# Patient Record
Sex: Female | Born: 1980 | Hispanic: No | Marital: Single | Smoking: Light tobacco smoker
Health system: Southern US, Community
[De-identification: ages and names within clinical notes are randomized; demographics above are authoritative.]

## PROBLEM LIST (undated history)

## (undated) DIAGNOSIS — K649 Unspecified hemorrhoids: Secondary | ICD-10-CM

## (undated) DIAGNOSIS — IMO0002 Reserved for concepts with insufficient information to code with codable children: Secondary | ICD-10-CM

## (undated) DIAGNOSIS — F329 Major depressive disorder, single episode, unspecified: Secondary | ICD-10-CM

## (undated) DIAGNOSIS — F32A Depression, unspecified: Secondary | ICD-10-CM

## (undated) DIAGNOSIS — F99 Mental disorder, not otherwise specified: Secondary | ICD-10-CM

## (undated) DIAGNOSIS — F410 Panic disorder [episodic paroxysmal anxiety] without agoraphobia: Secondary | ICD-10-CM

## (undated) DIAGNOSIS — R87619 Unspecified abnormal cytological findings in specimens from cervix uteri: Secondary | ICD-10-CM

## (undated) DIAGNOSIS — K219 Gastro-esophageal reflux disease without esophagitis: Secondary | ICD-10-CM

## (undated) DIAGNOSIS — F419 Anxiety disorder, unspecified: Secondary | ICD-10-CM

## (undated) DIAGNOSIS — D649 Anemia, unspecified: Secondary | ICD-10-CM

## (undated) HISTORY — PX: DILATION AND CURETTAGE OF UTERUS: SHX78

---

## 2010-09-21 LAB — ABO/RH: RH Type: POSITIVE

## 2010-09-21 LAB — RPR: RPR: NONREACTIVE

## 2010-09-21 LAB — ANTIBODY SCREEN: Antibody Screen: NEGATIVE

## 2010-09-21 LAB — HIV ANTIBODY (ROUTINE TESTING W REFLEX): HIV: NONREACTIVE

## 2011-02-12 NOTE — L&D Delivery Note (Signed)
Delivery Note At 1:20 PM a viable female was delivered via Vaginal, Spontaneous Delivery (Presentation: Left Occiput Anterior).  APGAR: 9, 9; weight 6 lb 11.8 oz (3056 g).   Placenta status: Intact, Spontaneous.  Cord: 3 vessels with the following complications: None.  Cord pH: none  Anesthesia: None  Episiotomy: None Lacerations: None Suture Repair: none Est. Blood Loss (mL): 300  Mom to postpartum.  Baby to nursery-stable.  Michelle Navarro A 02/15/2011, 2:00 PM

## 2011-02-13 LAB — STREP B DNA PROBE: GBS: NEGATIVE

## 2011-02-15 ENCOUNTER — Inpatient Hospital Stay (HOSPITAL_COMMUNITY)
Admission: AD | Admit: 2011-02-15 | Discharge: 2011-02-17 | DRG: 775 | Disposition: A | Discharge status: Home / Self Care | Payer: Medicaid Other | Source: Ambulatory Visit | Attending: Obstetrics | Admitting: Obstetrics

## 2011-02-15 ENCOUNTER — Encounter (HOSPITAL_COMMUNITY): Payer: Self-pay

## 2011-02-15 HISTORY — DX: Mental disorder, not otherwise specified: F99

## 2011-02-15 HISTORY — DX: Depression, unspecified: F32.A

## 2011-02-15 HISTORY — DX: Panic disorder (episodic paroxysmal anxiety): F41.0

## 2011-02-15 HISTORY — DX: Major depressive disorder, single episode, unspecified: F32.9

## 2011-02-15 HISTORY — DX: Unspecified abnormal cytological findings in specimens from cervix uteri: R87.619

## 2011-02-15 HISTORY — DX: Reserved for concepts with insufficient information to code with codable children: IMO0002

## 2011-02-15 HISTORY — DX: Anxiety disorder, unspecified: F41.9

## 2011-02-15 LAB — CBC
Hemoglobin: 10.5 g/dL — ABNORMAL LOW (ref 12.0–15.0)
MCH: 30.3 pg (ref 26.0–34.0)
MCHC: 32.5 g/dL (ref 30.0–36.0)
RDW: 13.1 % (ref 11.5–15.5)

## 2011-02-15 LAB — RPR: RPR Ser Ql: NONREACTIVE

## 2011-02-15 MED ORDER — ZOLPIDEM TARTRATE 5 MG PO TABS: 5.0000 mg | ORAL_TABLET | Freq: Every evening | ORAL | Status: DC | PRN

## 2011-02-15 MED ORDER — PENICILLIN G POTASSIUM 5000000 UNITS IJ SOLR
2.5000 10*6.[IU] | INTRAMUSCULAR | Status: DC
  Filled 2011-02-15 (×4): qty 2.5

## 2011-02-15 MED ORDER — MEDROXYPROGESTERONE ACETATE 150 MG/ML IM SUSP: 150.0000 mg | INTRAMUSCULAR | Status: DC | PRN

## 2011-02-15 MED ORDER — ONDANSETRON HCL 4 MG PO TABS: 4.0000 mg | ORAL_TABLET | ORAL | Status: DC | PRN

## 2011-02-15 MED ORDER — LACTATED RINGERS IV SOLN
INTRAVENOUS | Status: DC
  Administered 2011-02-15: 12:00:00 via INTRAVENOUS

## 2011-02-15 MED ORDER — LIDOCAINE HCL (PF) 1 % IJ SOLN
30.0000 mL | INTRAMUSCULAR | Status: DC | PRN
  Filled 2011-02-15: qty 30

## 2011-02-15 MED ORDER — OXYTOCIN BOLUS FROM INFUSION
500.0000 mL | Freq: Once | INTRAVENOUS | Status: DC
  Filled 2011-02-15: qty 1000
  Filled 2011-02-15: qty 500

## 2011-02-15 MED ORDER — WITCH HAZEL-GLYCERIN EX PADS: 1.0000 "application " | MEDICATED_PAD | CUTANEOUS | Status: DC | PRN

## 2011-02-15 MED ORDER — IBUPROFEN 600 MG PO TABS
600.0000 mg | ORAL_TABLET | Freq: Four times a day (QID) | ORAL | Status: DC
  Administered 2011-02-15 – 2011-02-17 (×7): 600 mg via ORAL
  Filled 2011-02-15 (×6): qty 1

## 2011-02-15 MED ORDER — BENZOCAINE-MENTHOL 20-0.5 % EX AERO: 1.0000 "application " | INHALATION_SPRAY | CUTANEOUS | Status: DC | PRN

## 2011-02-15 MED ORDER — CITRIC ACID-SODIUM CITRATE 334-500 MG/5ML PO SOLN: 30.0000 mL | ORAL | Status: DC | PRN

## 2011-02-15 MED ORDER — ACETAMINOPHEN 325 MG PO TABS: 650.0000 mg | ORAL_TABLET | ORAL | Status: DC | PRN

## 2011-02-15 MED ORDER — OXYTOCIN 20 UNITS IN LACTATED RINGERS INFUSION - SIMPLE: 125.0000 mL/h | INTRAVENOUS | Status: DC | PRN

## 2011-02-15 MED ORDER — PRENATAL MULTIVITAMIN CH
1.0000 | ORAL_TABLET | Freq: Every day | ORAL | Status: DC
  Administered 2011-02-15 – 2011-02-17 (×3): 1 via ORAL
  Filled 2011-02-15 (×3): qty 1

## 2011-02-15 MED ORDER — IBUPROFEN 600 MG PO TABS
600.0000 mg | ORAL_TABLET | Freq: Four times a day (QID) | ORAL | Status: DC | PRN
  Administered 2011-02-15: 600 mg via ORAL
  Filled 2011-02-15: qty 1

## 2011-02-15 MED ORDER — ARIPIPRAZOLE 2 MG PO TABS
2.0000 mg | ORAL_TABLET | Freq: Every day | ORAL | Status: DC
  Administered 2011-02-15 – 2011-02-17 (×3): 2 mg via ORAL
  Filled 2011-02-15 (×3): qty 1

## 2011-02-15 MED ORDER — OXYTOCIN 20 UNITS IN LACTATED RINGERS INFUSION - SIMPLE: 125.0000 mL/h | Freq: Once | INTRAVENOUS | Status: DC

## 2011-02-15 MED ORDER — MAGNESIUM HYDROXIDE 400 MG/5ML PO SUSP: 30.0000 mL | ORAL | Status: DC | PRN

## 2011-02-15 MED ORDER — DIPHENHYDRAMINE HCL 25 MG PO CAPS: 25.0000 mg | ORAL_CAPSULE | Freq: Four times a day (QID) | ORAL | Status: DC | PRN

## 2011-02-15 MED ORDER — NALBUPHINE SYRINGE 5 MG/0.5 ML
10.0000 mg | INJECTION | Freq: Four times a day (QID) | INTRAMUSCULAR | Status: DC | PRN
  Administered 2011-02-15: 10 mg via INTRAMUSCULAR
  Filled 2011-02-15 (×2): qty 1

## 2011-02-15 MED ORDER — PRENATAL 27-0.8 MG PO TABS: 1.0000 | ORAL_TABLET | Freq: Every day | ORAL | Status: DC

## 2011-02-15 MED ORDER — SERTRALINE HCL 50 MG PO TABS
50.0000 mg | ORAL_TABLET | Freq: Every day | ORAL | Status: DC
  Administered 2011-02-15 – 2011-02-17 (×3): 50 mg via ORAL
  Filled 2011-02-15 (×3): qty 1

## 2011-02-15 MED ORDER — ONDANSETRON HCL 4 MG/2ML IJ SOLN: 4.0000 mg | INTRAMUSCULAR | Status: DC | PRN

## 2011-02-15 MED ORDER — DIBUCAINE 1 % RE OINT: 1.0000 "application " | TOPICAL_OINTMENT | RECTAL | Status: DC | PRN

## 2011-02-15 MED ORDER — SIMETHICONE 80 MG PO CHEW: 80.0000 mg | CHEWABLE_TABLET | ORAL | Status: DC | PRN

## 2011-02-15 MED ORDER — LANOLIN HYDROUS EX OINT: TOPICAL_OINTMENT | CUTANEOUS | Status: DC | PRN

## 2011-02-15 MED ORDER — NALBUPHINE SYRINGE 5 MG/0.5 ML
10.0000 mg | INJECTION | INTRAMUSCULAR | Status: DC | PRN
  Administered 2011-02-15: 10 mg via INTRAVENOUS
  Filled 2011-02-15 (×2): qty 1

## 2011-02-15 MED ORDER — HYDROXYZINE HCL 50 MG/ML IM SOLN
50.0000 mg | Freq: Four times a day (QID) | INTRAMUSCULAR | Status: DC | PRN
  Administered 2011-02-15: 50 mg via INTRAMUSCULAR
  Filled 2011-02-15 (×2): qty 1

## 2011-02-15 MED ORDER — ONDANSETRON HCL 4 MG/2ML IJ SOLN: 4.0000 mg | Freq: Four times a day (QID) | INTRAMUSCULAR | Status: DC | PRN

## 2011-02-15 MED ORDER — TETANUS-DIPHTH-ACELL PERTUSSIS 5-2.5-18.5 LF-MCG/0.5 IM SUSP: 0.5000 mL | Freq: Once | INTRAMUSCULAR | Status: DC

## 2011-02-15 MED ORDER — LACTATED RINGERS IV SOLN: 500.0000 mL | INTRAVENOUS | Status: DC | PRN

## 2011-02-15 MED ORDER — OXYCODONE-ACETAMINOPHEN 5-325 MG PO TABS: 2.0000 | ORAL_TABLET | ORAL | Status: DC | PRN

## 2011-02-15 MED ORDER — OXYTOCIN 10 UNIT/ML IJ SOLN
INTRAMUSCULAR | Status: AC
  Administered 2011-02-15: 20 [IU]
  Filled 2011-02-15: qty 2

## 2011-02-15 MED ORDER — SENNOSIDES-DOCUSATE SODIUM 8.6-50 MG PO TABS
2.0000 | ORAL_TABLET | Freq: Every day | ORAL | Status: DC
  Administered 2011-02-15 – 2011-02-16 (×2): 2 via ORAL

## 2011-02-15 MED ORDER — PENICILLIN G POTASSIUM 5000000 UNITS IJ SOLR
5.0000 10*6.[IU] | Freq: Once | INTRAMUSCULAR | Status: DC
  Administered 2011-02-15: 5 10*6.[IU] via INTRAVENOUS
  Filled 2011-02-15: qty 5

## 2011-02-15 MED ORDER — OXYCODONE-ACETAMINOPHEN 5-325 MG PO TABS
1.0000 | ORAL_TABLET | ORAL | Status: DC | PRN
  Administered 2011-02-15 – 2011-02-17 (×5): 1 via ORAL
  Filled 2011-02-15 (×5): qty 1

## 2011-02-15 MED ORDER — METHYLERGONOVINE MALEATE 0.2 MG PO TABS: 0.2000 mg | ORAL_TABLET | ORAL | Status: DC | PRN

## 2011-02-15 MED ORDER — FLEET ENEMA 7-19 GM/118ML RE ENEM: 1.0000 | ENEMA | RECTAL | Status: DC | PRN

## 2011-02-15 MED ORDER — METHYLERGONOVINE MALEATE 0.2 MG/ML IJ SOLN: 0.2000 mg | INTRAMUSCULAR | Status: DC | PRN

## 2011-02-15 NOTE — H&P (Signed)
Michelle Navarro is a 31 y.o. female presenting for UC's. Maternal Medical History:  Reason for admission: Reason for admission: contractions.  Contractions: Onset was 6-12 hours ago.   Frequency: regular.   Duration is approximately 1 minute.   Perceived severity is strong.    Fetal activity: Perceived fetal activity is normal.   Last perceived fetal movement was within the past hour.    Prenatal complications: no prenatal complications Prenatal Complications - Diabetes: none.    OB History    Grav Para Term Preterm Abortions TAB SAB Ect Mult Living   3 1 1  0 1 1 0 0 0 1     Past Medical History  Diagnosis Date  . Abnormal Pap smear   . Anxiety     Zoloft  . Panic attacks   . Mental disorder   . Depression     Zoloft and Abilify- currently not taking   Past Surgical History  Procedure Date  . Dilation and curettage of uterus     2006   Family History: family history is not on file. Social History:  reports that she has never smoked. She does not have any smokeless tobacco history on file. She reports that she does not drink alcohol or use illicit drugs.  Review of Systems  All other systems reviewed and are negative.    Dilation: 7.5 Effacement (%): 90 Station: 0;+1 Exam by:: Charna Archer, RN Blood pressure 124/74, pulse 80, temperature 98.2 F (36.8 C), temperature source Oral, resp. rate 18, height 5\' 2"  (1.575 m), weight 58.968 kg (130 lb). Maternal Exam:  Uterine Assessment: Contraction strength is firm.  Contraction duration is 1 minute. Contraction frequency is regular.   Abdomen: Patient reports no abdominal tenderness. Fetal presentation: vertex  Introitus: Normal vulva. Normal vagina.  Pelvis: adequate for delivery.   Cervix: Cervix evaluated by digital exam.     Physical Exam  Nursing note and vitals reviewed. Constitutional: She is oriented to person, place, and time. She appears well-developed and well-nourished.  HENT:  Head: Normocephalic  and atraumatic.  Eyes: Conjunctivae are normal. Pupils are equal, round, and reactive to light.  Neck: Normal range of motion. Neck supple.  Cardiovascular: Normal rate.   Respiratory: Effort normal and breath sounds normal.  GI: Soft.  Genitourinary: Vagina normal and uterus normal.  Musculoskeletal: Normal range of motion.  Neurological: She is alert and oriented to person, place, and time.  Skin: Skin is warm and dry.  Psychiatric: She has a normal mood and affect. Her behavior is normal.    Prenatal labs: ABO, Rh: A/Positive/-- (08/10 0000) Antibody: Negative (08/10 0000) Rubella: Immune (08/10 0000) RPR: Nonreactive (08/10 0000)  HBsAg: Negative (08/10 0000)  HIV: Non-reactive (08/10 0000)  GBS:     Assessment/Plan: Term pregnancy.  Active labor.  Admit.   HARPER,CHARLES A 02/15/2011, 12:37 PM

## 2011-02-15 NOTE — Progress Notes (Signed)
Started today,  Blood noted in stool after voiding.  Pelvic pressure.  Dr said she would need antibiotics

## 2011-02-16 LAB — CBC
HCT: 24.1 % — ABNORMAL LOW (ref 36.0–46.0)
Platelets: 202 10*3/uL (ref 150–400)
RDW: 13.2 % (ref 11.5–15.5)
WBC: 11.6 10*3/uL — ABNORMAL HIGH (ref 4.0–10.5)

## 2011-02-16 MED ORDER — BENZOCAINE-MENTHOL 20-0.5 % EX AERO
INHALATION_SPRAY | CUTANEOUS | Status: AC
  Filled 2011-02-16: qty 56

## 2011-02-16 NOTE — Progress Notes (Signed)
PSYCHOSOCIAL ASSESSMENT ~ MATERNAL/CHILD Name:  Michelle Navarro Age 31 day Referral Date  02/16/11 Reason/Source  H/O Depression, anxiety and panic attacks  I. FAMILY/HOME ENVIRONMENT A. Child's Legal Guardian  Parent(s) X  Grandparent     Foster parent    DSS  Name  Rudine Plummer DOB  07/19/1980 Age 31 y/o Address  [address], [place], [place]  [address]  Name DOB Age  Address  Other Household Members/Support Persons Name  Willie Thomas Relationship  Child's Godfather DOB        Name  Dynaija Love Navarro                   Relationship  Sibling                   DOB  8 Years old        Name                   Relationship                   DOB                   Name                   Relationship                   DOB C.   Other Support   II. PSYCHOSOCIAL DATA A. Information Source  Patient Interview X  Family Interview           Other B. Financial and Community Resources  Employment    Medicaid  X   County   [place]       Private Insurance                                  Self Pay   Food Stamps X     WIC  X (Pt said she was signed up for WIC)  [employer]       Public Housing      Section 8     Maternity Care Coordination/Child Service Coordination/Early Intervention    School  Grade      Other Cultural and Environment Information Cultural Issues Impacting Care  III. STRENGTHS  Supportive family/friends  Yes   Adequate Resources  Yes Compliance with medical plan  Yes  Home prepared for Child (including basic supplies)  Yes Understanding of illness Yes          Other  IV. RISK FACTORS AND CURRENT PROBLEMS      No Problems Note   Substance Abuse                                             Pt Family             Mental Illness     Pt  X (see SW Assessment) Family               Family/Relationship Issues   Pt Family      Abuse/Neglect/Domestic Violence   Pt Family   Financial Resources     Pt Family  Transportation     Pt Family  DSS  Involvement    Pt Family  Adjustment   to Illness    Pt Family   Knowledge/Cognitive Deficit   Pt Family   Compliance with Treatment   Pt Family   Basic Needs (food, housing, etc)  Pt Family  Housing Concerns    Pt Family  Other             V. SOCIAL WORK ASSESSMENT SW received consult due to pt history of depression, anxiety and panic attacks.  Pt reported she has a counselor and psychiatrist who address her mental health issues.  Pt said she did not take her Zoloft or Abilify during the pregnancy, but started them both again yesterday.  Pt reported feeling calm and knew what to expect with this second child.  Pt said FOB is not involved and that the Godfather is very supportive.  Pt also stated she has several friends who are helpfull.    Pt's RN stated pt declined to feed baby throughout the night.  SW encouraged pt to follow RN directions.  Pt stated she wanted to allow the baby to sleep through the night.  SW provided pt with "Feelings after birth" brochure.  Pt stated she could use extra supplies.  SW to follow up.  Encouraged pt to contact SW with any questions or concerns.    VI. SOCIAL WORK PLAN (In [place]) No Further Intervention Required/No Barriers to Discharge Psychosocial Support and Ongoing Assessment of Needs Patient/Family  Education Child Protective Services Report  County        Date Information/Referral to Community Resources   Other 

## 2011-02-16 NOTE — Progress Notes (Signed)
Pt refuses to wake baby to feed every 2-3 hours states that she has a 8 yr. Old at home doesn't want to mess up baby sleeping pattern baby is  Bottle feed  Mother continue to be enourage to feed baby every 2-3 hours.

## 2011-02-16 NOTE — Progress Notes (Signed)
Post Pa11rtum Day 1 Subjective: no complaints  Objective: Blood pressure 105/67, pulse 60, temperature 97.9 F (36.6 C), temperature source Oral, resp. rate 18, height 5\' 2"  (1.575 m), weight 58.968 kg (130 lb), unknown if currently breastfeeding.  Physical Exam:  General: alert and no distress Lochia: appropriate Uterine Fundus: firm Incision: healing well DVT Evaluation: No evidence of DVT seen on physical exam.   Basename 02/16/11 0528 02/15/11 1440  HGB 8.0* 10.5*  HCT 24.1* 32.3*    Assessment/Plan: Plan for discharge tomorrow   LOS: 1 day   Tayron Hunnell A 02/16/2011, 11:07 AM

## 2011-02-16 NOTE — Progress Notes (Signed)
PSYCHOSOCIAL ASSESSMENT ~ MATERNAL/CHILD Name:  Michelle Navarro Age 31 day Referral Date  02/16/11 Reason/Source  H/O Depression, anxiety and panic attacks  I. FAMILY/HOME ENVIRONMENT A. Child's Legal Guardian  Parent(s) X  Precious Haws parent    DSS  Name  Cresta Riden DOB  09[birth year] Age 29 y/o Address  [address] [place], [place]  [address]  Name DOB Age  Address  Other Household Members/Support Persons Name  Corrinne Eagle Relationship  Child's Godfather DOB        Name  Dynaija Love [place]                   Relationship  Sibling                   DOB  21 Years old        Name                   Relationship                   DOB                   Name                   Relationship                   DOB C.   Other Support   II. PSYCHOSOCIAL DATA A. Information Source  Patient Interview X  Family Interview           Other B. Event organiser  Employment    [employer]  X   [employer]                                  Self Pay   Food Stamps X     WIC  X (Pt said she was signed up for [facility])  [employer]       [employer]      Section 8     Maternity Care Coordination/Child Service Coordination/Early Intervention    School  Grade      Other Cultural and Environment Information Cultural Issues Impacting Care  III. STRENGTHS  Supportive family/friends  Yes   Adequate Resources  Yes Compliance with medical plan  Yes  Home prepared for Child (including basic supplies)  Yes Understanding of illness Yes          Other  IV. RISK FACTORS AND CURRENT PROBLEMS      No Problems Note   Substance Abuse                                             Pt Family             Mental Illness     Pt  X (see SW Assessment) Family               Family/Relationship Issues   Pt Family      Abuse/Neglect/Domestic Violence   Pt Family   Financial Resources     Pt Family  Transportation     Pt Family  DSS  Involvement    Pt Family  Adjustment  to Illness    Pt Family   Knowledge/Cognitive Deficit   Pt Family   Compliance with Treatment   Pt Family   Basic Needs (food, housing, etc)  Pt Family  Housing Concerns    Pt Family  Other             V. SOCIAL WORK ASSESSMENT SW received consult due to pt history of depression, anxiety and panic attacks.  Pt reported she has a Veterinary surgeon and psychiatrist who address her mental health issues.  Pt said she did not take her Zoloft or Abilify during the pregnancy, but started them both again yesterday.  Pt reported feeling calm and knew what to expect with this second child.  Pt said FOB is not involved and that the Godfather is very supportive.  Pt also stated she has several friends who are helpfull.    Pt's RN stated pt declined to feed baby throughout the night.  SW encouraged pt to follow RN directions.  Pt stated she wanted to allow the baby to sleep through the night.  SW provided pt with "Feelings after birth" brochure.  Pt stated she could use extra supplies.  SW to follow up.  Encouraged pt to contact SW with any questions or concerns.    VI. SOCIAL WORK PLAN (In [place]) No Further Intervention Required/No Barriers to Discharge Psychosocial Support and Ongoing Assessment of Needs Patient/Family  Education Child Protective Services Report  [place]        Date Information/Referral to [facility]   Other

## 2011-02-17 MED ORDER — IBUPROFEN 600 MG PO TABS: 600.0000 mg | ORAL_TABLET | Freq: Four times a day (QID) | ORAL | Status: DC

## 2011-02-17 MED ORDER — OXYCODONE-ACETAMINOPHEN 5-325 MG PO TABS: 1.0000 | ORAL_TABLET | ORAL | Status: DC | PRN

## 2011-02-17 NOTE — Progress Notes (Signed)
Post Partum Day 2 Subjective: no complaints  Objective: Blood pressure 128/86, pulse 65, temperature 98.2 F (36.8 C), temperature source Oral, resp. rate 18, height 5\' 2"  (1.575 m), weight 58.968 kg (130 lb), unknown if currently breastfeeding.  Physical Exam:  General: alert and no distress Lochia: appropriate Uterine Fundus: firm Incision: healing well DVT Evaluation: No evidence of DVT seen on physical exam.   Basename 02/16/11 0528 02/15/11 1440  HGB 8.0* 10.5*  HCT 24.1* 32.3*    Assessment/Plan: Discharge home   LOS: 2 days   Taura Lamarre A 02/17/2011, 9:26 AM

## 2011-02-17 NOTE — Progress Notes (Signed)
SW contacted Security and requested a baby bundle for pt prior to discharge.

## 2011-02-17 NOTE — Discharge Summary (Signed)
Obstetric Discharge Summary Reason for Admission: onset of labor Prenatal Procedures: ultrasound Intrapartum Procedures: spontaneous vaginal delivery Postpartum Procedures: none Complications-Operative and Postpartum: none Hemoglobin  Date Value Range Status  02/16/2011 8.0* 12.0-15.0 (g/dL) Final     DELTA CHECK NOTED     REPEATED TO VERIFY     HCT  Date Value Range Status  02/16/2011 24.1* 36.0-46.0 (%) Final    Discharge Diagnoses: Term Pregnancy-delivered  Discharge Information: Date: 02/17/2011 Activity: pelvic rest Diet: routine Medications: PNV, Ibuprofen, Colace and Percocet Condition: stable Instructions: refer to practice specific booklet and Routine Discharge to: home Follow-up Information    Follow up with Taiyana Kissler A, MD. Make an appointment in 6 weeks.   Contact information:   [address] [place] [address] [phone]          Newborn Data: Live born female  Birth Weight: 6 lb 11.8 oz (3056 g) APGAR: 9, 9  Home with mother.  Florentine Diekman A 02/17/2011, 9:33 AM

## 2011-02-18 NOTE — Progress Notes (Signed)
Post discharge chart review completed.  

## 2012-05-07 ENCOUNTER — Ambulatory Visit: Payer: Self-pay | Admitting: Obstetrics

## 2013-12-13 ENCOUNTER — Encounter (HOSPITAL_COMMUNITY): Payer: Self-pay

## 2014-02-24 ENCOUNTER — Ambulatory Visit (INDEPENDENT_AMBULATORY_CARE_PROVIDER_SITE_OTHER): Payer: Medicaid Other | Admitting: Obstetrics

## 2014-02-24 ENCOUNTER — Encounter: Payer: Self-pay | Admitting: Obstetrics

## 2014-02-24 VITALS — BP 100/71 | HR 91 | Temp 98.4°F | Ht 63.0 in | Wt 143.0 lb

## 2014-02-24 DIAGNOSIS — K649 Unspecified hemorrhoids: Secondary | ICD-10-CM

## 2014-02-24 DIAGNOSIS — L02439 Carbuncle of limb, unspecified: Secondary | ICD-10-CM

## 2014-02-24 DIAGNOSIS — L02429 Furuncle of limb, unspecified: Secondary | ICD-10-CM

## 2014-02-24 DIAGNOSIS — B373 Candidiasis of vulva and vagina: Secondary | ICD-10-CM

## 2014-02-24 DIAGNOSIS — B3731 Acute candidiasis of vulva and vagina: Secondary | ICD-10-CM

## 2014-02-24 DIAGNOSIS — Z0000 Encounter for general adult medical examination without abnormal findings: Secondary | ICD-10-CM

## 2014-02-24 MED ORDER — CLINDAMYCIN HCL 300 MG PO CAPS: 300.0000 mg | ORAL_CAPSULE | Freq: Three times a day (TID) | ORAL | Status: DC

## 2014-02-24 MED ORDER — CITRANATAL HARMONY 27-1-260 MG PO CAPS: 1.0000 | ORAL_CAPSULE | Freq: Every day | ORAL | Status: DC

## 2014-02-24 MED ORDER — HYDROCORTISONE 0.5 % EX CREA: 1.0000 "application " | TOPICAL_CREAM | Freq: Two times a day (BID) | CUTANEOUS | Status: DC

## 2014-02-24 MED ORDER — FLUCONAZOLE 150 MG PO TABS: 150.0000 mg | ORAL_TABLET | Freq: Once | ORAL | Status: DC

## 2014-03-01 ENCOUNTER — Encounter: Payer: Self-pay | Admitting: Obstetrics

## 2014-03-01 NOTE — Progress Notes (Signed)
Assessment    Boil in axilla  Hemorrhoids    Plan    Clindamycin Rx Hydrocortisone cream Rx F/U prn.   No orders of the defined types were placed in this encounter.   Meds ordered this encounter  Medications  . clindamycin (CLEOCIN) 300 MG capsule    Sig: Take 1 capsule (300 mg total) by mouth 3 (three) times daily.    Dispense:  30 capsule    Refill:  4  . fluconazole (DIFLUCAN) 150 MG tablet    Sig: Take 1 tablet (150 mg total) by mouth once.    Dispense:  1 tablet    Refill:  2  . hydrocortisone cream 0.5 %    Sig: Apply 1 application topically 2 (two) times daily.    Dispense:  30 g    Refill:  5  . Prenat-FeFmCb-DSS-FA-DHA w/o A (CITRANATAL HARMONY) 27-1-260 MG CAPS    Sig: Take 1 capsule by mouth daily before breakfast.    Dispense:  30 capsule    Refill:  11       Patient ID: Michelle Navarro, female   DOB: 09/28/1980, 34 y.o.   MRN: [id number]  Chief Complaint  Patient presents with  . Problem    Skin     HPI Michelle Navarro is a 34 y.o. female.  Recurrent boils in axilla.  Hemorrhoids, painful.  HPI  Past Medical History  Diagnosis Date  . Abnormal Pap smear   . Anxiety     Zoloft  . Panic attacks   . Mental disorder   . Depression     Zoloft and Abilify- currently not taking    Past Surgical History  Procedure Laterality Date  . Dilation and curettage of uterus      2006    History reviewed. No pertinent family history.  Social History History  Substance Use Topics  . Smoking status: Never Smoker   . Smokeless tobacco: Not on file  . Alcohol Use: No    No Known Allergies  Current Outpatient Prescriptions  Medication Sig Dispense Refill  . ARIPiprazole (ABILIFY) 2 MG tablet Take 2 mg by mouth daily.      . clindamycin (CLEOCIN) 300 MG capsule Take 1 capsule (300 mg total) by mouth 3 (three) times daily. 30 capsule 4  . fluconazole (DIFLUCAN) 150 MG tablet Take 1 tablet (150 mg total) by mouth once. 1 tablet 2  . hydrocortisone  cream 0.5 % Apply 1 application topically 2 (two) times daily. 30 g 5  . ibuprofen (ADVIL,MOTRIN) 600 MG tablet Take 1 tablet (600 mg total) by mouth every 6 (six) hours. 30 tablet 5  . oxyCODONE-acetaminophen (PERCOCET) 5-325 MG per tablet Take 1-2 tablets by mouth every 3 (three) hours as needed (moderate - severe pain). 40 tablet 0  . Prenat-FeFmCb-DSS-FA-DHA w/o A (CITRANATAL HARMONY) 27-1-260 MG CAPS Take 1 capsule by mouth daily before breakfast. 30 capsule 11  . Prenatal Vit-Fe Fumarate-FA (MULTIVITAMIN-PRENATAL) 27-0.8 MG TABS Take 1 tablet by mouth daily.      . sertraline (ZOLOFT) 50 MG tablet Take 50 mg by mouth daily.       No current facility-administered medications for this visit.    Review of Systems Review of Systems Constitutional: negative for fatigue and weight loss Respiratory: negative for cough and wheezing Cardiovascular: negative for chest pain, fatigue and palpitations Gastrointestinal: negative for abdominal pain and change in bowel habits.  Painful hemorrhoids. Genitourinary:negative Integument/breast: negative for nipple discharge.  Positive for recurrent axillary boils. Musculoskeletal:negative  for myalgias Neurological: negative for gait problems and tremors Behavioral/Psych: negative for abusive relationship, depression Endocrine: negative for temperature intolerance     Blood pressure 100/71, pulse 91, temperature 98.4 F (36.9 C), height  (1.6 m), weight 143 lb (64.864 kg), last menstrual period 02/05/2014, unknown if currently breastfeeding.  Physical Exam Physical Exam General:   alert  Skin:   healing boil in left axilla    Data Reviewed Labs

## 2014-05-05 ENCOUNTER — Ambulatory Visit: Payer: Medicaid Other | Admitting: Obstetrics

## 2015-02-12 DIAGNOSIS — K649 Unspecified hemorrhoids: Secondary | ICD-10-CM

## 2015-02-12 HISTORY — DX: Unspecified hemorrhoids: K64.9

## 2015-02-12 NOTE — L&D Delivery Note (Signed)
35 y.o. [id number] at 1728w0d delivered a viable female infant in cephalic, OA position. No nuchal cord. Anterior shoulder delivered with ease. 60 sec delayed cord clamping. Cord clamped x2 and cut. Placenta delivered spontaneously intact, with 3VC. Fundus firm on exam with massage and pitocin. Good hemostasis noted.  Laceration: None Suture: NA EBL: 100 cc Good hemostasis noted.  Mom and baby recovering in LDR.    Apgars: 8, 9 Weight: pending  Michelle MarchYashika Ahrianna Siglin, MD PGY-1 01/17/2016, 12:49 AM

## 2015-06-07 ENCOUNTER — Inpatient Hospital Stay (HOSPITAL_COMMUNITY)
Admission: AD | Admit: 2015-06-07 | Discharge: 2015-06-07 | Discharge status: Against Medical Advice | Payer: Medicaid Other | Source: Ambulatory Visit | Attending: Obstetrics | Admitting: Obstetrics

## 2015-06-07 ENCOUNTER — Emergency Department (HOSPITAL_COMMUNITY): Payer: Medicaid Other

## 2015-06-07 ENCOUNTER — Encounter (HOSPITAL_COMMUNITY): Payer: Self-pay | Admitting: Family Medicine

## 2015-06-07 ENCOUNTER — Emergency Department (HOSPITAL_COMMUNITY)
Admission: EM | Admit: 2015-06-07 | Discharge: 2015-06-07 | Disposition: A | Discharge status: Home / Self Care | Payer: Medicaid Other | Attending: Emergency Medicine | Admitting: Emergency Medicine

## 2015-06-07 DIAGNOSIS — Z3A00 Weeks of gestation of pregnancy not specified: Secondary | ICD-10-CM | POA: Diagnosis not present

## 2015-06-07 DIAGNOSIS — Z5321 Procedure and treatment not carried out due to patient leaving prior to being seen by health care provider: Secondary | ICD-10-CM | POA: Insufficient documentation

## 2015-06-07 DIAGNOSIS — Z8659 Personal history of other mental and behavioral disorders: Secondary | ICD-10-CM | POA: Insufficient documentation

## 2015-06-07 DIAGNOSIS — Z79899 Other long term (current) drug therapy: Secondary | ICD-10-CM | POA: Diagnosis not present

## 2015-06-07 DIAGNOSIS — O9989 Other specified diseases and conditions complicating pregnancy, childbirth and the puerperium: Secondary | ICD-10-CM | POA: Diagnosis present

## 2015-06-07 DIAGNOSIS — O210 Mild hyperemesis gravidarum: Secondary | ICD-10-CM | POA: Insufficient documentation

## 2015-06-07 DIAGNOSIS — Z3490 Encounter for supervision of normal pregnancy, unspecified, unspecified trimester: Secondary | ICD-10-CM

## 2015-06-07 DIAGNOSIS — K5900 Constipation, unspecified: Secondary | ICD-10-CM | POA: Diagnosis not present

## 2015-06-07 DIAGNOSIS — O99611 Diseases of the digestive system complicating pregnancy, first trimester: Secondary | ICD-10-CM | POA: Diagnosis not present

## 2015-06-07 LAB — LIPASE, BLOOD: LIPASE: 23 U/L (ref 11–51)

## 2015-06-07 LAB — URINALYSIS, ROUTINE W REFLEX MICROSCOPIC
BILIRUBIN URINE: NEGATIVE
GLUCOSE, UA: NEGATIVE mg/dL
HGB URINE DIPSTICK: NEGATIVE
Ketones, ur: NEGATIVE mg/dL
Leukocytes, UA: NEGATIVE
Nitrite: NEGATIVE
PH: 6.5 (ref 5.0–8.0)
Protein, ur: NEGATIVE mg/dL
SPECIFIC GRAVITY, URINE: 1.028 (ref 1.005–1.030)

## 2015-06-07 LAB — COMPREHENSIVE METABOLIC PANEL
ALT: 15 U/L (ref 14–54)
AST: 15 U/L (ref 15–41)
Albumin: 3.3 g/dL — ABNORMAL LOW (ref 3.5–5.0)
Alkaline Phosphatase: 62 U/L (ref 38–126)
Anion gap: 9 (ref 5–15)
BILIRUBIN TOTAL: 0.3 mg/dL (ref 0.3–1.2)
BUN: 11 mg/dL (ref 6–20)
CHLORIDE: 108 mmol/L (ref 101–111)
CO2: 19 mmol/L — AB (ref 22–32)
Calcium: 8.7 mg/dL — ABNORMAL LOW (ref 8.9–10.3)
Creatinine, Ser: 0.65 mg/dL (ref 0.44–1.00)
GLUCOSE: 99 mg/dL (ref 65–99)
Potassium: 3.9 mmol/L (ref 3.5–5.1)
Sodium: 136 mmol/L (ref 135–145)
Total Protein: 6.2 g/dL — ABNORMAL LOW (ref 6.5–8.1)

## 2015-06-07 LAB — CBC
HEMATOCRIT: 33.8 % — AB (ref 36.0–46.0)
HEMOGLOBIN: 11.2 g/dL — AB (ref 12.0–15.0)
MCH: 30.9 pg (ref 26.0–34.0)
MCHC: 33.1 g/dL (ref 30.0–36.0)
MCV: 93.1 fL (ref 78.0–100.0)
Platelets: 289 10*3/uL (ref 150–400)
RBC: 3.63 MIL/uL — ABNORMAL LOW (ref 3.87–5.11)
RDW: 12.4 % (ref 11.5–15.5)
WBC: 10.2 10*3/uL (ref 4.0–10.5)

## 2015-06-07 LAB — I-STAT BETA HCG BLOOD, ED (MC, WL, AP ONLY): I-stat hCG, quantitative: >2000 m[IU]/mL — ABNORMAL HIGH (ref <5)

## 2015-06-07 MED ORDER — FLEET ENEMA 7-19 GM/118ML RE ENEM: 1.0000 | ENEMA | Freq: Once | RECTAL | Status: DC

## 2015-06-07 MED ORDER — MAGNESIUM CITRATE PO SOLN: 1.0000 | Freq: Once | ORAL | Status: DC

## 2015-06-07 MED ORDER — POLYETHYLENE GLYCOL 3350 17 G PO PACK: 17.0000 g | PACK | Freq: Every day | ORAL | Status: DC

## 2015-06-07 NOTE — MAU Note (Addendum)
Pt presents with a myriad of issues, C/O vomiting for the past 3 months approximately once a month,  hx of chronic constipation - has had one BM this month.  Also C/O chest pain & tightness for the past 6 months, pain is worse at night & she cannot sleep,  sharp abdominal pain.  Feels like her body is toxic.  Is losing her hair, skin problems.

## 2015-06-07 NOTE — MAU Note (Signed)
Pt left, told registration staff she could not wait any longer.

## 2015-06-07 NOTE — Discharge Instructions (Signed)

## 2015-06-07 NOTE — ED Provider Notes (Signed)
CSN: [id number]     Arrival date & time 06/07/15  1628 History   First MD Initiated Contact with Patient 06/07/15 2010     Chief Complaint  Patient presents with  . Abdominal Pain  . Emesis     (Consider location/radiation/quality/duration/timing/severity/associated sxs/prior Treatment) HPI   Patient has a PMH of anxiety, panic attacks, mental disorder, depression (Zoloft and Abilify- not currently taking). She tried going to [facility] before arriving at [facility] but left due to too long of a wait. She reports a few days of  vomiting, hx of chronic constipation but the past couple of months she has only been having one bowel movement has tried to take naturopathic medications and herbs which will work for a short amount of time and then stophelping. She also endorses a vague CP and chest tightness that has been persisting for the past 6 months that is worse at night and makes sleeping difficult but she has not been having lately. She endorses financial stress and she has a 30 yo daughter in the room with here. 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 Also complaining of sharp abdominal pains that are mid abdominal, mild. She told the nurse, and myself that she has concern for her body being toxic because she is loosing hair and having skin problems. Upon further questioning she said that she has been talking to scientists, friends and doing research and is concerned about toxins from her stool staying in her abdomen too long.    Past Medical History  Diagnosis Date  . Abnormal Pap smear   . Anxiety     Zoloft  . Panic attacks   . Mental disorder   . Depression     Zoloft and Abilify- currently not taking   Past Surgical History  Procedure Laterality Date  . Dilation and curettage of uterus      2006   History reviewed. No pertinent family history. Social History  Substance Use Topics  . Smoking status: Never Smoker   . Smokeless tobacco: None  . Alcohol Use: No   OB  History    Gravida Para Term Preterm AB TAB SAB Ectopic Multiple Living   0 1 1 0 0 0 2     Review of Systems  Review of Systems All other systems negative except as documented in the HPI. All pertinent positives and negatives as reviewed in the HPI.   Allergies  Review of patient's allergies indicates no known allergies.  Home Medications   Prior to Admission medications   Medication Sig Start Date End Date Taking? Authorizing Provider  Multiple Vitamin (MULTIVITAMIN WITH MINERALS) TABS tablet Take 1 tablet by mouth daily.   Yes Historical Provider, MD  clindamycin (CLEOCIN) 300 MG capsule Take 1 capsule (300 mg total) by mouth 3 (three) times daily. Patient not taking: Reported on 06/07/2015 02/24/14   Brock Bad, MD  fluconazole (DIFLUCAN) 150 MG tablet Take 1 tablet (150 mg total) by mouth once. Patient not taking: Reported on 06/07/2015 02/24/14   Brock Bad, MD  hydrocortisone cream 0.5 % Apply 1 application topically 2 (two) times daily. Patient not taking: Reported on 06/07/2015 02/24/14   Brock Bad, MD  ibuprofen (ADVIL,MOTRIN) 600 MG tablet Take 1 tablet (600 mg total) by mouth every 6 (six) hours. 02/17/11 02/27/11  Brock Bad, MD  oxyCODONE-acetaminophen (PERCOCET) 5-325 MG per tablet Take 1-2 tablets by mouth every 3 (three) hours as needed (moderate - severe  pain). 02/17/11 02/27/11  Brock Bad, MD  Prenat-FeFmCb-DSS-FA-DHA w/o A (CITRANATAL HARMONY) 27-1-260 MG CAPS Take 1 capsule by mouth daily before breakfast. Patient not taking: Reported on 06/07/2015 02/24/14   Brock Bad, MD   BP 107/64 mmHg  Pulse 74  Temp(Src) 98.3 F (36.8 C) (Oral)  Resp 23  SpO2 100%  LMP 05/08/2015 Physical Exam  Constitutional: She appears well-developed and well-nourished. No distress.  HENT:  Head: Normocephalic and atraumatic.  Right Ear: Tympanic membrane and ear canal normal.  Left Ear: Tympanic membrane and ear canal normal.  Nose: Nose normal.   Mouth/Throat: Uvula is midline, oropharynx is clear and moist and mucous membranes are normal.  Eyes: Pupils are equal, round, and reactive to light.  Neck: Normal range of motion. Neck supple.  Cardiovascular: Normal rate and regular rhythm.   Pulmonary/Chest: Effort normal.  Abdominal: Soft. She exhibits distension. There is no hepatosplenomegaly. There is no tenderness. There is no rigidity, no rebound, no guarding and no CVA tenderness.  No signs of abdominal distention  Genitourinary:  Pt declines pelvic  Musculoskeletal:  No LE swelling  Neurological: She is alert.  Skin: Skin is warm and dry. No rash noted.  Nursing note and vitals reviewed.   ED Course  Procedures (including critical care time) Labs Review Labs Reviewed  COMPREHENSIVE METABOLIC PANEL - Abnormal; Notable for the following:    CO2 19 (*)    Calcium 8.7 (*)    Total Protein 6.2 (*)    Albumin 3.3 (*)    All other components within normal limits  CBC - Abnormal; Notable for the following:    RBC 3.63 (*)    Hemoglobin 11.2 (*)    HCT 33.8 (*)    All other components within normal limits  I-STAT BETA HCG BLOOD, ED ([facility], [facility], [facility] ONLY) - Abnormal; Notable for the following:    I-stat hCG, quantitative >2000.0 (*)    All other components within normal limits  LIPASE, BLOOD  URINALYSIS, ROUTINE W REFLEX MICROSCOPIC (NOT AT [facility])    Imaging Review Dg Abd Acute W/chest  06/07/2015  CLINICAL DATA:  Chest pain with tightness.  Constipation EXAM: DG ABDOMEN ACUTE W/ 1V CHEST COMPARISON:  None. FINDINGS: Large stool volume correlating with the history. No obstruction or impaction. No pneumatosis or pneumoperitoneum. Normal heart size and mediastinal contours. No acute infiltrate or edema. No effusion or pneumothorax. No acute osseous findings. IMPRESSION: 1. Prominent stool volume correlating with history of constipation. No obstruction or impaction. 2. Clear chest. Electronically Signed   By: Marnee Spring M.D.    On: 06/07/2015 21:48   I have personally reviewed and evaluated these images and lab results as part of my medical decision-making.   EKG Interpretation None      MDM   Final diagnoses:  Constipation  Pregnant   8: 53 pm Patient states she found a medication for me to write that she feels comfortable with for her constipation and asks that I write her for Misoprostol, I have  Advised that this is actually a medication used for abortion, as well as for treatment of ulcers but not for constipation. She is irritated and becomes upset and asks "well what about the good things it will do for my stomach". I informed her that it is outside of my scope to induce abortion from the ER.  She declines pelvic, she declines OB US. She says that she is not going to keep the baby because it does  not benefit her financially and emotionally in her life right now. She asks that I get an xray of her stomach to evaluate for stool burden. I advised that this will cause increased radiation to her fetus but she reports not caring about the baby and wants the xray as this is what she is here for and cares about.  10; 04 pm Patient admits that she knew she was pregnant and the medication she is asking for causes abortions. [facility] pregnancy and she is happy about the referral - she says she has a lot of stuff to do and can't be pregnant right now.  Her xray is positive for large stool volume, will rx an enema and laxative and start her on Miralax for 2 weeks. Will also refer back to GI or PCP. At this time patient is requesting discharge. Patient is well-appearing, nontoxic with normal vital signs.  I discussed results, diagnoses and plan with Candise CheSherrod Pitsenbarger. They voice there understanding and questions were answered. We discussed follow-up recommendations and return precautions.   Marlon Peliffany Belia Febo, PA-C 06/07/15 2206  Nelva Nayobert Beaton, MD 06/07/15  2223

## 2015-06-07 NOTE — ED Notes (Signed)
Pt here for abd pain, cramping, vomiting and some rectal bleeding. sts she only stools twice a month.

## 2015-06-26 ENCOUNTER — Encounter: Payer: Medicaid Other | Admitting: Obstetrics

## 2015-07-12 ENCOUNTER — Encounter: Payer: Self-pay | Admitting: Obstetrics

## 2015-07-12 ENCOUNTER — Ambulatory Visit (INDEPENDENT_AMBULATORY_CARE_PROVIDER_SITE_OTHER): Payer: Medicaid Other | Admitting: Obstetrics

## 2015-07-12 VITALS — BP 118/62 | HR 82 | Wt 132.0 lb

## 2015-07-12 DIAGNOSIS — Z3490 Encounter for supervision of normal pregnancy, unspecified, unspecified trimester: Secondary | ICD-10-CM

## 2015-07-12 DIAGNOSIS — K5901 Slow transit constipation: Secondary | ICD-10-CM

## 2015-07-12 DIAGNOSIS — Z3687 Encounter for antenatal screening for uncertain dates: Secondary | ICD-10-CM

## 2015-07-12 LAB — POCT URINALYSIS DIPSTICK
Bilirubin, UA: NEGATIVE
Blood, UA: NEGATIVE
Glucose, UA: NEGATIVE
Ketones, UA: NEGATIVE
LEUKOCYTES UA: NEGATIVE
Nitrite, UA: NEGATIVE
PH UA: 5
PROTEIN UA: NEGATIVE
SPEC GRAV UA: 1.025
UROBILINOGEN UA: NEGATIVE

## 2015-07-12 MED ORDER — DOCUSATE SODIUM 100 MG PO CAPS: 100.0000 mg | ORAL_CAPSULE | Freq: Two times a day (BID) | ORAL | Status: DC

## 2015-07-12 NOTE — Progress Notes (Signed)
Subjective:    Michelle Navarro is being seen today for her first obstetrical visit.  This is a planned pregnancy. She is at 7561w0d gestation. Her obstetrical history is significant for none. Relationship with FOB: significant other, not living together. Patient does intend to breast feed. Pregnancy history fully reviewed.  The information documented in the HPI was reviewed and verified.  Menstrual History: OB History    Gravida Para Term Preterm AB TAB SAB Ectopic Multiple Living   5 2 2  0 1 1 0 0 0 2       Patient's last menstrual period was 03/29/2015.    Past Medical History  Diagnosis Date  . Abnormal Pap smear   . Anxiety     Zoloft  . Panic attacks   . Mental disorder   . Depression     Zoloft and Abilify- currently not taking    Past Surgical History  Procedure Laterality Date  . Dilation and curettage of uterus      2006     (Not in a hospital admission) No Known Allergies  Social History  Substance Use Topics  . Smoking status: Never Smoker   . Smokeless tobacco: Not on file  . Alcohol Use: No    No family history on file.   Review of Systems Constitutional: negative for weight loss Gastrointestinal: negative for vomiting Genitourinary:negative for genital lesions and vaginal discharge and dysuria Musculoskeletal:negative for back pain Behavioral/Psych: negative for abusive relationship, depression, illegal drug usage and tobacco use    Objective:    BP 118/62 mmHg  Pulse 82  Wt 132 lb (59.875 kg)  LMP 03/29/2015  Breastfeeding? Unknown General Appearance:    Alert, cooperative, no distress, appears stated age  Head:    Normocephalic, without obvious abnormality, atraumatic  Eyes:    PERRL, conjunctiva/corneas clear, EOM's intact, fundi    benign, both eyes  Ears:    Normal TM's and external ear canals, both ears  Nose:   Nares normal, septum midline, mucosa normal, no drainage    or sinus tenderness  Throat:   Lips, mucosa, and tongue normal;  teeth and gums normal  Neck:   Supple, symmetrical, trachea midline, no adenopathy;    thyroid:  no enlargement/tenderness/nodules; no carotid   bruit or JVD  Back:     Symmetric, no curvature, ROM normal, no CVA tenderness  Lungs:     Clear to auscultation bilaterally, respirations unlabored  Chest Wall:    No tenderness or deformity   Heart:    Regular rate and rhythm, S1 and S2 normal, no murmur, rub   or gallop  Breast Exam:    No tenderness, masses, or nipple abnormality  Abdomen:     Soft, non-tender, bowel sounds active all four quadrants,    no masses, no organomegaly  Genitalia:    Normal female without lesion, discharge or tenderness  Extremities:   Extremities normal, atraumatic, no cyanosis or edema  Pulses:   2+ and symmetric all extremities  Skin:   Skin color, texture, turgor normal, no rashes or lesions  Lymph nodes:   Cervical, supraclavicular, and axillary nodes normal  Neurologic:   CNII-XII intact, normal strength, sensation and reflexes    throughout      Lab Review Urine pregnancy test Labs reviewed yes Radiologic studies reviewed no   Assessment:    Pregnancy at 4361w0d weeks    Unsure LMP   Plan:    Ultrasound ordered  Prenatal vitamins.  Counseling provided regarding continued  use of seat belts, cessation of alcohol consumption, smoking or use of illicit drugs; infection precautions i.e., influenza/TDAP immunizations, toxoplasmosis,CMV, parvovirus, listeria and varicella; workplace safety, exercise during pregnancy; routine dental care, safe medications, sexual activity, hot tubs, saunas, pools, travel, caffeine use, fish and methlymercury, potential toxins, hair treatments, varicose veins Weight gain recommendations per IOM guidelines reviewed: underweight/BMI< 18.5--> gain 28 - 40 lbs; normal weight/BMI 18.5 - 24.9--> gain 25 - 35 lbs; overweight/BMI 25 - 29.9--> gain 15 - 25 lbs; obese/BMI >30->gain  11 - 20 lbs Problem list reviewed and  updated. FIRST/CF mutation testing/NIPT/QUAD SCREEN/fragile X/Ashkenazi Jewish population testing/Spinal muscular atrophy discussed: requested. Role of ultrasound in pregnancy discussed; fetal survey: requested. Amniocentesis discussed: not indicated. VBAC calculator score: VBAC consent form provided No orders of the defined types were placed in this encounter.   Orders Placed This Encounter  Procedures  . Culture, OB Urine  . US OB Comp + 14 Wk    Standing Status: Future     Number of Occurrences:      Standing Expiration Date: 09/10/2016    Order Specific Question:  Reason for Exam (SYMPTOM  OR DIAGNOSIS REQUIRED)    Answer:  unsure LMP    Order Specific Question:  Preferred imaging location?    Answer:  Internal  . Prenatal Profile I  . HIV antibody  . Hemoglobinopathy evaluation  . AFP, Quad Screen    Order Specific Question:  Repeat Sample    Answer:  No    Order Specific Question:  Pregnancy Donor Egg (Y/N)    Answer:  No    Order Specific Question:  Gest Age at U/S (Wk.Dy)    Answer:  n/a    Order Specific Question:  LMP:    Answer:  03/29/2015    Order Specific Question:  Number of Fetuses    Answer:  1    Order Specific Question:  Hx of OSB/NTD?    Answer:  No    Order Specific Question:  History of Down Syndrome?    Answer:  No    Order Specific Question:  Maternal IDDM (insulin-dependent diabetes mellitus)    Answer:  No  . POCT urinalysis dipstick    Follow up in 2 weeks.

## 2015-07-13 ENCOUNTER — Encounter: Payer: Self-pay | Admitting: Obstetrics

## 2015-07-13 ENCOUNTER — Other Ambulatory Visit: Payer: Self-pay | Admitting: Obstetrics

## 2015-07-13 ENCOUNTER — Ambulatory Visit (INDEPENDENT_AMBULATORY_CARE_PROVIDER_SITE_OTHER): Payer: Medicaid Other

## 2015-07-13 DIAGNOSIS — Z3490 Encounter for supervision of normal pregnancy, unspecified, unspecified trimester: Secondary | ICD-10-CM

## 2015-07-13 DIAGNOSIS — O3680X1 Pregnancy with inconclusive fetal viability, fetus 1: Secondary | ICD-10-CM

## 2015-07-13 DIAGNOSIS — Z3687 Encounter for antenatal screening for uncertain dates: Secondary | ICD-10-CM

## 2015-07-13 NOTE — Addendum Note (Signed)
Addended by: Coral CeoHARPER, Gabrian Hoque A on: 07/13/2015 01:58 PM   Modules accepted: Orders

## 2015-07-14 LAB — HEMOGLOBINOPATHY EVALUATION
HGB C: 0 %
HGB S: 0 %
Hemoglobin A2 Quantitation: 2.9 % (ref 0.7–3.1)
Hemoglobin F Quantitation: 0 % (ref 0.0–2.0)
Hgb A: 97.1 % (ref 94.0–98.0)

## 2015-07-14 LAB — PRENATAL PROFILE I(LABCORP)
ANTIBODY SCREEN: NEGATIVE
BASOS: 0 %
Basophils Absolute: 0 10*3/uL (ref 0.0–0.2)
EOS (ABSOLUTE): 0.1 10*3/uL (ref 0.0–0.4)
Eos: 1 %
HEMATOCRIT: 36.1 % (ref 34.0–46.6)
HEMOGLOBIN: 11.9 g/dL (ref 11.1–15.9)
HEP B S AG: NEGATIVE
IMMATURE GRANS (ABS): 0 10*3/uL (ref 0.0–0.1)
Immature Granulocytes: 0 %
LYMPHS ABS: 1.8 10*3/uL (ref 0.7–3.1)
Lymphs: 17 %
MCH: 30.1 pg (ref 26.6–33.0)
MCHC: 33 g/dL (ref 31.5–35.7)
MCV: 91 fL (ref 79–97)
MONOS ABS: 0.7 10*3/uL (ref 0.1–0.9)
Monocytes: 6 %
NEUTROS ABS: 8.2 10*3/uL — AB (ref 1.4–7.0)
Neutrophils: 76 %
Platelets: 277 10*3/uL (ref 150–379)
RBC: 3.96 x10E6/uL (ref 3.77–5.28)
RDW: 12.2 % — AB (ref 12.3–15.4)
RPR: NONREACTIVE
RUBELLA: 5.37 {index} (ref >0.99)
Rh Factor: POSITIVE
WBC: 10.9 10*3/uL — AB (ref 3.4–10.8)

## 2015-07-14 LAB — HIV ANTIBODY (ROUTINE TESTING W REFLEX): HIV SCREEN 4TH GENERATION: NONREACTIVE

## 2015-07-14 LAB — CULTURE, OB URINE

## 2015-07-14 LAB — URINE CULTURE, OB REFLEX

## 2015-07-26 ENCOUNTER — Ambulatory Visit (INDEPENDENT_AMBULATORY_CARE_PROVIDER_SITE_OTHER): Payer: Medicaid Other | Admitting: Obstetrics

## 2015-07-26 VITALS — BP 120/68 | HR 95 | Wt 149.0 lb

## 2015-07-26 DIAGNOSIS — Z3492 Encounter for supervision of normal pregnancy, unspecified, second trimester: Secondary | ICD-10-CM

## 2015-07-26 DIAGNOSIS — Z3689 Encounter for other specified antenatal screening: Secondary | ICD-10-CM

## 2015-07-26 LAB — POCT URINALYSIS DIPSTICK
Bilirubin, UA: NEGATIVE
Blood, UA: NEGATIVE
Glucose, UA: NEGATIVE
Ketones, UA: NEGATIVE
LEUKOCYTES UA: NEGATIVE
Nitrite, UA: NEGATIVE
PH UA: 6.5
PROTEIN UA: NEGATIVE
SPEC GRAV UA: 1.02
Urobilinogen, UA: NEGATIVE

## 2015-07-27 ENCOUNTER — Encounter: Payer: Self-pay | Admitting: Obstetrics

## 2015-07-27 NOTE — Progress Notes (Signed)
  Subjective:    Michelle Navarro is a 35 y.o. female being seen today for her obstetrical visit. She is at 5041w1d gestation. Patient reports: no complaints.  Problem List Items Addressed This Visit    None    Visit Diagnoses    Prenatal care in second trimester    -  Primary    Relevant Orders    US OB Comp + 14 Wk    US OB Transvaginal    POCT urinalysis dipstick (Completed)    Encounter for fetal anatomic survey        Relevant Orders    US OB Comp + 14 Wk    US OB Transvaginal      There are no active problems to display for this patient.   Objective:     BP 120/68 mmHg  Pulse 95  Wt 149 lb (67.586 kg)  LMP 03/29/2015 Uterine Size: Below umbilicus     Assessment:    Pregnancy @ 1941w1d  weeks Doing well    Plan:    Problem list reviewed and updated. Labs reviewed.  Follow up in 4 weeks. FIRST/CF mutation testing/NIPT/QUAD SCREEN/fragile X/Ashkenazi Jewish population testing/Spinal muscular atrophy discussed: requested. Role of ultrasound in pregnancy discussed; fetal survey: requested. Amniocentesis discussed: not indicated.

## 2015-08-24 ENCOUNTER — Encounter: Payer: Self-pay | Admitting: Obstetrics

## 2015-08-24 ENCOUNTER — Ambulatory Visit (INDEPENDENT_AMBULATORY_CARE_PROVIDER_SITE_OTHER): Payer: Medicaid Other | Admitting: Obstetrics

## 2015-08-24 ENCOUNTER — Ambulatory Visit (INDEPENDENT_AMBULATORY_CARE_PROVIDER_SITE_OTHER): Payer: Medicaid Other

## 2015-08-24 VITALS — BP 117/75 | HR 80 | Wt 164.5 lb

## 2015-08-24 DIAGNOSIS — Z3492 Encounter for supervision of normal pregnancy, unspecified, second trimester: Secondary | ICD-10-CM

## 2015-08-24 DIAGNOSIS — Z36 Encounter for antenatal screening of mother: Secondary | ICD-10-CM

## 2015-08-24 DIAGNOSIS — Z3689 Encounter for other specified antenatal screening: Secondary | ICD-10-CM

## 2015-08-24 LAB — POCT URINALYSIS DIPSTICK
Bilirubin, UA: NEGATIVE
Blood, UA: NEGATIVE
GLUCOSE UA: NEGATIVE
Ketones, UA: NEGATIVE
Leukocytes, UA: NEGATIVE
Nitrite, UA: NEGATIVE
PROTEIN UA: NEGATIVE
Spec Grav, UA: 1.01
Urobilinogen, UA: NEGATIVE
pH, UA: 5

## 2015-08-24 NOTE — Progress Notes (Signed)
Patient is seeing a digestive health specialist for constipation- 7/24. She is seeing dermatology for dark areas and discoloration. Discussed safety during pregnancy and she is to be very careful with the treatments she chooses.

## 2015-08-24 NOTE — Progress Notes (Signed)
Subjective:    Michelle Navarro is a [age] y.o. female being seen today for her obstetrical visit. She is at 3469w1d gestation. Patient reports: no complaints . Fetal movement: normal.  Problem List Items Addressed This Visit    None    Visit Diagnoses    Prenatal care, second trimester    -  Primary    Relevant Orders    POCT urinalysis dipstick (Completed)      There are no active problems to display for this patient.  Objective:    BP 117/75 mmHg  Pulse 80  Wt 164 lb 8 oz (74.617 kg)  LMP 03/29/2015 FHT: 150 BPM  Uterine Size: size equals dates     Assessment:    Pregnancy @ 3169w1d    Plan:    Signs and symptoms of preterm labor: discussed.  Labs, problem list reviewed and updated 2 hr GTT planned Follow up in 4 weeks.

## 2015-09-21 ENCOUNTER — Ambulatory Visit (INDEPENDENT_AMBULATORY_CARE_PROVIDER_SITE_OTHER): Payer: Medicaid Other | Admitting: Obstetrics

## 2015-09-21 VITALS — BP 112/67 | HR 92 | Temp 98.4°F | Wt 168.2 lb

## 2015-09-21 DIAGNOSIS — Z3492 Encounter for supervision of normal pregnancy, unspecified, second trimester: Secondary | ICD-10-CM

## 2015-09-21 LAB — POCT URINALYSIS DIPSTICK
Bilirubin, UA: NEGATIVE
Blood, UA: NEGATIVE
GLUCOSE UA: NEGATIVE
KETONES UA: NEGATIVE
LEUKOCYTES UA: NEGATIVE
Nitrite, UA: NEGATIVE
Protein, UA: NEGATIVE
SPEC GRAV UA: 1.015
Urobilinogen, UA: 0.2
pH, UA: 8

## 2015-09-21 MED ORDER — VITAFOL GUMMIES 3.33-0.333-34.8 MG PO CHEW: 3.0000 | CHEWABLE_TABLET | Freq: Every day | ORAL | 3 refills | Status: AC

## 2015-09-21 NOTE — Progress Notes (Signed)
Patient denies concerns at this time, she remained on her cell phone throughout the entire conversation.

## 2015-09-21 NOTE — Progress Notes (Signed)
Subjective:    Michelle Navarro is a 35 y.o. female being seen today for her obstetrical visit. She is at 4025w1d gestation. Patient reports: no complaints . Fetal movement: normal.  Problem List Items Addressed This Visit    None    Visit Diagnoses    Prenatal care, second trimester    -  Primary   Relevant Orders   POCT Urinalysis Dipstick (Completed)     There are no active problems to display for this patient.  Objective:    BP 112/67   Pulse 92   Temp 98.4 F (36.9 C)   Wt 168 lb 3.2 oz (76.3 kg)   LMP 03/29/2015   BMI 29.80 kg/m  FHT: 150 BPM  Uterine Size: size equals dates     Assessment:    Pregnancy @ 6425w1d    Plan:    OBGCT: ordered for next visit. Signs and symptoms of preterm labor: discussed.  Labs, problem list reviewed and updated 2 hr GTT planned Follow up in 4 weeks.

## 2015-09-22 ENCOUNTER — Encounter: Payer: Self-pay | Admitting: Obstetrics

## 2015-10-19 ENCOUNTER — Ambulatory Visit (INDEPENDENT_AMBULATORY_CARE_PROVIDER_SITE_OTHER): Payer: Medicaid Other | Admitting: Obstetrics

## 2015-10-19 ENCOUNTER — Encounter: Payer: Self-pay | Admitting: Obstetrics

## 2015-10-19 ENCOUNTER — Other Ambulatory Visit: Payer: Medicaid Other

## 2015-10-19 VITALS — BP 110/68 | HR 90 | Temp 98.0°F | Wt 175.8 lb

## 2015-10-19 DIAGNOSIS — Z3493 Encounter for supervision of normal pregnancy, unspecified, third trimester: Secondary | ICD-10-CM

## 2015-10-19 NOTE — Progress Notes (Signed)
Patient ID: Michelle Navarro, female   DOB: 03/31/1980, 35 y.o.   MRN: [id number] Subjective:    Michelle Navarro is a 35 y.o. female being seen today for her obstetrical visit. She is at 819w1d gestation. Patient reports no complaints. Fetal movement: normal.  Problem List Items Addressed This Visit    None    Visit Diagnoses    Prenatal care, third trimester    -  Primary   Relevant Orders   Glucose Tolerance, 2 Hours w/1 Hour   CBC   HIV antibody   RPR     There are no active problems to display for this patient.  Objective:    BP 110/68   Pulse 90   Temp 98 F (36.7 C)   Wt 175 lb 12.8 oz (79.7 kg)   LMP 03/29/2015   BMI 31.14 kg/m  FHT:  150 BPM  Uterine Size: size equals dates  Presentation: unsure     Assessment:    Pregnancy @ 629w1d weeks   Plan:     labs reviewed, problem list updated Consent signed. GBS sent TDAP offered  Rhogam given for RH negative Pediatrician: discussed. Infant feeding: plans to breastfeed. Maternity leave: discussed. Cigarette smoking: never smoked. Orders Placed This Encounter  Procedures  . Glucose Tolerance, 2 Hours w/1 Hour  . CBC  . HIV antibody  . RPR   No orders of the defined types were placed in this encounter.  Follow up in 1 Week.

## 2015-10-19 NOTE — Progress Notes (Signed)
Patient reports she is doing well today. 

## 2015-10-20 LAB — CBC
HEMATOCRIT: 34.8 % (ref 34.0–46.6)
Hemoglobin: 11.3 g/dL (ref 11.1–15.9)
MCH: 30.8 pg (ref 26.6–33.0)
MCHC: 32.5 g/dL (ref 31.5–35.7)
MCV: 95 fL (ref 79–97)
Platelets: 298 10*3/uL (ref 150–379)
RBC: 3.67 x10E6/uL — ABNORMAL LOW (ref 3.77–5.28)
RDW: 13.9 % (ref 12.3–15.4)
WBC: 10.7 10*3/uL (ref 3.4–10.8)

## 2015-10-20 LAB — GLUCOSE TOLERANCE, 2 HOURS W/ 1HR
Glucose, 1 hour: 169 mg/dL (ref 65–179)
Glucose, 2 hour: 124 mg/dL (ref 65–152)
Glucose, Fasting: 93 mg/dL — ABNORMAL HIGH (ref 65–91)

## 2015-10-20 LAB — HIV ANTIBODY (ROUTINE TESTING W REFLEX): HIV Screen 4th Generation wRfx: NONREACTIVE

## 2015-10-20 LAB — RPR: RPR Ser Ql: NONREACTIVE

## 2015-11-03 ENCOUNTER — Encounter: Payer: Self-pay | Admitting: Obstetrics

## 2015-11-03 ENCOUNTER — Ambulatory Visit (INDEPENDENT_AMBULATORY_CARE_PROVIDER_SITE_OTHER): Payer: Medicaid Other | Admitting: Obstetrics

## 2015-11-03 VITALS — BP 101/64 | HR 84 | Temp 98.5°F | Wt 169.6 lb

## 2015-11-03 DIAGNOSIS — Z3493 Encounter for supervision of normal pregnancy, unspecified, third trimester: Secondary | ICD-10-CM | POA: Diagnosis not present

## 2015-11-03 DIAGNOSIS — Z3490 Encounter for supervision of normal pregnancy, unspecified, unspecified trimester: Secondary | ICD-10-CM | POA: Insufficient documentation

## 2015-11-03 NOTE — Progress Notes (Signed)
Patient is in office and states that she feels great. Patient also stated that she does not want flu shot because it makes her sick.

## 2015-11-03 NOTE — Progress Notes (Signed)
Subjective:    Michelle Navarro is a 35 y.o. female being seen today for her obstetrical visit. She is at 4637w2d gestation. Patient reports no complaints. Fetal movement: normal.  Problem List Items Addressed This Visit    None    Visit Diagnoses   None.    There are no active problems to display for this patient.  Objective:    BP 101/64   Pulse 84   Temp 98.5 F (36.9 C)   Wt 169 lb 9.6 oz (76.9 kg)   LMP 03/29/2015   BMI 30.04 kg/m  FHT:  150 BPM  Uterine Size: size equals dates  Presentation: unsure     Assessment:    Pregnancy @ 3837w2d weeks   Plan:     labs reviewed, problem list updated Consent signed. GBS sent TDAP offered  Rhogam given for RH negative Pediatrician: discussed. Infant feeding: plans to breastfeed. Maternity leave: discussed. Cigarette smoking: never smoked. No orders of the defined types were placed in this encounter.  No orders of the defined types were placed in this encounter.  Follow up in 2 Weeks.   Patient ID: Michelle Navarro, female   DOB: 03/27/1980, 35 y.o.   MRN: [id number]

## 2015-11-23 ENCOUNTER — Ambulatory Visit (INDEPENDENT_AMBULATORY_CARE_PROVIDER_SITE_OTHER): Payer: Medicaid Other | Admitting: Obstetrics

## 2015-11-23 ENCOUNTER — Telehealth: Payer: Self-pay

## 2015-11-23 ENCOUNTER — Encounter: Payer: Self-pay | Admitting: Obstetrics

## 2015-11-23 VITALS — BP 102/65 | HR 91 | Wt 172.0 lb

## 2015-11-23 DIAGNOSIS — Z3403 Encounter for supervision of normal first pregnancy, third trimester: Secondary | ICD-10-CM

## 2015-11-23 DIAGNOSIS — J301 Allergic rhinitis due to pollen: Secondary | ICD-10-CM

## 2015-11-23 MED ORDER — DESLORATADINE-PSEUDOEPHED ER 5-240 MG PO TB24: 1.0000 | ORAL_TABLET | Freq: Every day | ORAL | 5 refills | Status: DC

## 2015-11-23 NOTE — Progress Notes (Signed)
Patient is concerned about her weight gain 

## 2015-11-23 NOTE — Telephone Encounter (Signed)
Patient called and stated that the Clarinex rx is not covered by Medicaid and wants something else to be prescribed, routed to provider for review.

## 2015-11-23 NOTE — Progress Notes (Signed)
Subjective:    Michelle Navarro is a 35 y.o. female being seen today for her obstetrical visit. She is at 4516w1d gestation. Patient reports no complaints. Fetal movement: normal.  Problem List Items Addressed This Visit    None    Visit Diagnoses    Acute seasonal allergic rhinitis due to pollen    -  Primary   Relevant Medications   desloratadine-pseudoephedrine (CLARINEX-D 24 HOUR) 5-240 MG 24 hr tablet     Patient Active Problem List   Diagnosis Date Noted  . Supervision of normal pregnancy, antepartum 11/03/2015   Objective:    BP 102/65   Pulse 91   Wt 172 lb (78 kg)   LMP 03/29/2015   BMI 30.47 kg/m  FHT:  150 BPM  Uterine Size: size equals dates  Presentation: unsure     Assessment:    Pregnancy @ 3516w1d weeks   Plan:     labs reviewed, problem list updated Consent signed. GBS sent TDAP offered  Rhogam given for RH negative Pediatrician: discussed. Infant feeding: plans to breastfeed. Maternity leave: discussed. Cigarette smoking: never smoked. No orders of the defined types were placed in this encounter.  Meds ordered this encounter  Medications  . desloratadine-pseudoephedrine (CLARINEX-D 24 HOUR) 5-240 MG 24 hr tablet    Sig: Take 1 tablet by mouth daily.    Dispense:  30 tablet    Refill:  5   Follow up in 2 Weeks.   Patient ID: Michelle Navarro, female   DOB: 03/18/1980, 35 y.o.   MRN: [id number]

## 2015-11-24 NOTE — Telephone Encounter (Signed)
1:29 Pharmacy called and the 24 hour is not available- they want to know if it is ok to give 12 hour. Called pharmacy and ok'd 12 hour.

## 2015-11-30 ENCOUNTER — Other Ambulatory Visit: Payer: Self-pay | Admitting: Obstetrics

## 2015-11-30 MED ORDER — LORATADINE 10 MG PO TABS: 10.0000 mg | ORAL_TABLET | Freq: Every day | ORAL | 99 refills | Status: AC

## 2015-11-30 NOTE — Telephone Encounter (Signed)
Claritin Rx

## 2015-12-07 ENCOUNTER — Encounter: Payer: Medicaid Other | Admitting: Obstetrics

## 2015-12-15 ENCOUNTER — Encounter: Payer: Medicaid Other | Admitting: Obstetrics

## 2015-12-19 ENCOUNTER — Ambulatory Visit: Payer: Medicaid Other | Admitting: Obstetrics and Gynecology

## 2015-12-19 DIAGNOSIS — Z3490 Encounter for supervision of normal pregnancy, unspecified, unspecified trimester: Secondary | ICD-10-CM

## 2015-12-19 NOTE — Progress Notes (Signed)
Patient was in office, refused all care and to be seen by provider after initial intake questions/vitals were complete.

## 2015-12-19 NOTE — Progress Notes (Signed)
Patient is in the office and states that she feels good, reports good fetal movement. 

## 2015-12-20 ENCOUNTER — Telehealth: Payer: Self-pay

## 2015-12-20 NOTE — Telephone Encounter (Signed)
Pt had appointment on 11-7 with Dr. Alysia PennaErvin but refuse care due to she only wanted to see Dr. Clearance CootsHarper and only want him to deliver her baby and she thought this was a [facility]. It was explained by the nurse and by myself that Dr. Clearance CootsHarper do not deliver babies anymore and [facility] will be gladly to take care of her. Pt was also advised by Dr. Clearance CootsHarper as well that he do not deliver babies and our Midwives will be happy to take care of her. Patient was offered to make a follow up and At this time patient is refusing to make follow up appointment with our office.

## 2015-12-20 NOTE — Telephone Encounter (Signed)
error 

## 2015-12-22 NOTE — Progress Notes (Signed)
Please see nursing notes Dr. Clearance CootsHarper discussed office changes with pt.

## 2016-01-10 ENCOUNTER — Telehealth: Payer: Self-pay

## 2016-01-10 NOTE — Telephone Encounter (Signed)
Pt called on 11/28 and stated she was past her due date and would like an appointment to see her doctor. Patient was scheduled with Dr. Vergie LivingPickens due to Dr. Clearance CootsHarper schedule was full. Pt refuse to see Dr. Vergie LivingPickens due to she only want to see Dr. Clearance CootsHarper. Explained to the patient that since she is post dates and wanted to come in sooner then next week, Dr. Vergie LivingPickens has the first available appointment on 11/30. Pt still refuse to accept 11/30 appointment and want to see Dr. Clearance CootsHarper. Pt was scheduled on Monday, 12/4 10:45. Pt stated that she really want to go into labor naturally and do not want to be induced and would wait to see if she go into labor this week or over the weekend. If not then she will come to appt on Monday, 12/4

## 2016-01-11 ENCOUNTER — Telehealth: Payer: Self-pay

## 2016-01-11 ENCOUNTER — Encounter: Payer: Medicaid Other | Admitting: Obstetrics and Gynecology

## 2016-01-11 ENCOUNTER — Encounter: Payer: Medicaid Other | Admitting: Obstetrics

## 2016-01-11 NOTE — Telephone Encounter (Signed)
Left message for pt. Dr. Clearance CootsHarper has a cancellation on his schedule today at 2:45. Dr. Clearance CootsHarper can see her today. Waiting for patient to call back.

## 2016-01-11 NOTE — Telephone Encounter (Signed)
Continuation of conversation from yesterday.  Ms Orvan FalconerCampbell was requesting a sooner appt with Dr Clearance CootsHarper.  We had a cancellation and scheduled her an appointment for 01/11/16.  Was unable to reach pt and left a message about being able to see her today.  Contacted pt again and was able to speak with her about appointment.  Patient stated that she was just going to wait until Monday and come in.  Stated that she is very emotional at this time and is ready to deliver. Does not want to be induced, so she will just wait it out over the weekend and if she doesn't deliver she will just be here on Monday.

## 2016-01-15 ENCOUNTER — Telehealth (HOSPITAL_COMMUNITY): Payer: Self-pay | Admitting: *Deleted

## 2016-01-15 ENCOUNTER — Ambulatory Visit (INDEPENDENT_AMBULATORY_CARE_PROVIDER_SITE_OTHER): Payer: Medicaid Other | Admitting: Obstetrics

## 2016-01-15 ENCOUNTER — Encounter: Payer: Self-pay | Admitting: Obstetrics

## 2016-01-15 ENCOUNTER — Other Ambulatory Visit: Payer: Self-pay | Admitting: Advanced Practice Midwife

## 2016-01-15 DIAGNOSIS — O480 Post-term pregnancy: Secondary | ICD-10-CM | POA: Diagnosis not present

## 2016-01-15 DIAGNOSIS — Z3490 Encounter for supervision of normal pregnancy, unspecified, unspecified trimester: Secondary | ICD-10-CM

## 2016-01-15 NOTE — Telephone Encounter (Signed)
Preadmission screen  

## 2016-01-15 NOTE — Progress Notes (Signed)
Subjective:    Michelle Navarro is a [age] y.o. female being seen today for her obstetrical visit. She is at 873w5d gestation. Patient reports occasional contractions. Fetal movement: normal.  Problem List Items Addressed This Visit    Supervision of normal pregnancy, antepartum     Patient Active Problem List   Diagnosis Date Noted  . Supervision of normal pregnancy, antepartum 11/03/2015    Objective:    BP 112/73   Pulse 92   Temp 98.1 F (36.7 C)   Wt 177 lb 4.8 oz (80.4 kg)   LMP 03/29/2015   BMI 31.41 kg/m  FHT:  150 BPM  Uterine Size: size equals dates  Presentation: cephalic  Pelvic Exam:              Dilation: 2cm       Effacement: 50%   Station:  -3     Consistency: medium            Position: posterior     Assessment:    Pregnancy @ 183w5d  weeks   Plan:    Postdates management: discussed fetal surveillance and induction, discussed fetal movement, NST reactive. Induction: scheduled for 01-16-16, written information given.  Follow up postpartum

## 2016-01-15 NOTE — Addendum Note (Signed)
Addended by: JAMES, QUINETTA C on: 01/15/2016 01:22 PM   Modules accepted: Orders  

## 2016-01-16 ENCOUNTER — Inpatient Hospital Stay (HOSPITAL_COMMUNITY)
Admission: RE | Admit: 2016-01-16 | Discharge: 2016-01-18 | DRG: 775 | Disposition: A | Discharge status: Home / Self Care | Payer: Medicaid Other | Source: Ambulatory Visit | Attending: Obstetrics & Gynecology | Admitting: Obstetrics & Gynecology

## 2016-01-16 ENCOUNTER — Encounter (HOSPITAL_COMMUNITY): Payer: Self-pay

## 2016-01-16 DIAGNOSIS — F17210 Nicotine dependence, cigarettes, uncomplicated: Secondary | ICD-10-CM | POA: Diagnosis present

## 2016-01-16 DIAGNOSIS — O2243 Hemorrhoids in pregnancy, third trimester: Secondary | ICD-10-CM | POA: Diagnosis present

## 2016-01-16 DIAGNOSIS — O480 Post-term pregnancy: Secondary | ICD-10-CM | POA: Diagnosis present

## 2016-01-16 DIAGNOSIS — O99334 Smoking (tobacco) complicating childbirth: Secondary | ICD-10-CM | POA: Diagnosis present

## 2016-01-16 DIAGNOSIS — Z3A41 41 weeks gestation of pregnancy: Secondary | ICD-10-CM

## 2016-01-16 DIAGNOSIS — O9962 Diseases of the digestive system complicating childbirth: Secondary | ICD-10-CM | POA: Diagnosis present

## 2016-01-16 DIAGNOSIS — Z3A40 40 weeks gestation of pregnancy: Secondary | ICD-10-CM | POA: Diagnosis not present

## 2016-01-16 DIAGNOSIS — K219 Gastro-esophageal reflux disease without esophagitis: Secondary | ICD-10-CM | POA: Diagnosis present

## 2016-01-16 HISTORY — DX: Unspecified hemorrhoids: K64.9

## 2016-01-16 HISTORY — DX: Gastro-esophageal reflux disease without esophagitis: K21.9

## 2016-01-16 HISTORY — DX: Anemia, unspecified: D64.9

## 2016-01-16 LAB — GROUP B STREP BY PCR: GROUP B STREP BY PCR: NEGATIVE

## 2016-01-16 LAB — CBC
HCT: 33.7 % — ABNORMAL LOW (ref 36.0–46.0)
Hemoglobin: 11.4 g/dL — ABNORMAL LOW (ref 12.0–15.0)
MCH: 31.5 pg (ref 26.0–34.0)
MCHC: 33.8 g/dL (ref 30.0–36.0)
MCV: 93.1 fL (ref 78.0–100.0)
PLATELETS: 249 10*3/uL (ref 150–400)
RBC: 3.62 MIL/uL — AB (ref 3.87–5.11)
RDW: 13.8 % (ref 11.5–15.5)
WBC: 10.1 10*3/uL (ref 4.0–10.5)

## 2016-01-16 LAB — RPR: RPR: NONREACTIVE

## 2016-01-16 LAB — TYPE AND SCREEN
ABO/RH(D): A POS
Antibody Screen: NEGATIVE

## 2016-01-16 LAB — OB RESULTS CONSOLE GBS: STREP GROUP B AG: NEGATIVE

## 2016-01-16 LAB — ABO/RH: ABO/RH(D): A POS

## 2016-01-16 MED ORDER — OXYTOCIN BOLUS FROM INFUSION
500.0000 mL | Freq: Once | INTRAVENOUS | Status: AC
  Administered 2016-01-17: 500 mL via INTRAVENOUS

## 2016-01-16 MED ORDER — ACETAMINOPHEN 325 MG PO TABS: 650.0000 mg | ORAL_TABLET | ORAL | Status: DC | PRN

## 2016-01-16 MED ORDER — LACTATED RINGERS IV SOLN
500.0000 mL | INTRAVENOUS | Status: DC | PRN
  Administered 2016-01-16: 1000 mL via INTRAVENOUS
  Administered 2016-01-16: 250 mL via INTRAVENOUS
  Administered 2016-01-16: 1000 mL via INTRAVENOUS
  Administered 2016-01-16 (×2): 250 mL via INTRAVENOUS

## 2016-01-16 MED ORDER — FENTANYL CITRATE (PF) 100 MCG/2ML IJ SOLN
100.0000 ug | INTRAMUSCULAR | Status: DC | PRN
  Administered 2016-01-16 (×2): 100 ug via INTRAVENOUS
  Filled 2016-01-16 (×2): qty 2

## 2016-01-16 MED ORDER — LACTATED RINGERS IV SOLN
INTRAVENOUS | Status: DC
Start: 2016-01-16 — End: 2016-01-17
  Administered 2016-01-16 (×3): via INTRAVENOUS

## 2016-01-16 MED ORDER — OXYCODONE-ACETAMINOPHEN 5-325 MG PO TABS: 2.0000 | ORAL_TABLET | ORAL | Status: DC | PRN

## 2016-01-16 MED ORDER — OXYTOCIN 40 UNITS IN LACTATED RINGERS INFUSION - SIMPLE MED
1.0000 m[IU]/min | INTRAVENOUS | Status: DC
  Administered 2016-01-16: 2 m[IU]/min via INTRAVENOUS
  Filled 2016-01-16: qty 1000

## 2016-01-16 MED ORDER — SOD CITRATE-CITRIC ACID 500-334 MG/5ML PO SOLN: 30.0000 mL | ORAL | Status: DC | PRN

## 2016-01-16 MED ORDER — TERBUTALINE SULFATE 1 MG/ML IJ SOLN
0.2500 mg | Freq: Once | INTRAMUSCULAR | Status: DC | PRN
Start: 2016-01-16 — End: 2016-01-17
  Filled 2016-01-16: qty 1

## 2016-01-16 MED ORDER — OXYCODONE-ACETAMINOPHEN 5-325 MG PO TABS: 1.0000 | ORAL_TABLET | ORAL | Status: DC | PRN

## 2016-01-16 MED ORDER — MISOPROSTOL 25 MCG QUARTER TABLET
25.0000 ug | ORAL_TABLET | ORAL | Status: DC | PRN
  Filled 2016-01-16: qty 1

## 2016-01-16 MED ORDER — TERBUTALINE SULFATE 1 MG/ML IJ SOLN
0.2500 mg | Freq: Once | INTRAMUSCULAR | Status: DC | PRN
  Filled 2016-01-16: qty 1

## 2016-01-16 MED ORDER — OXYTOCIN 40 UNITS IN LACTATED RINGERS INFUSION - SIMPLE MED: 2.5000 [IU]/h | INTRAVENOUS | Status: DC

## 2016-01-16 MED ORDER — ONDANSETRON HCL 4 MG/2ML IJ SOLN: 4.0000 mg | Freq: Four times a day (QID) | INTRAMUSCULAR | Status: DC | PRN

## 2016-01-16 MED ORDER — LIDOCAINE HCL (PF) 1 % IJ SOLN
30.0000 mL | INTRAMUSCULAR | Status: DC | PRN
  Filled 2016-01-16: qty 30

## 2016-01-16 NOTE — Progress Notes (Signed)
S: Patient seen & examined for progress of labor. Patient comfortable. Currently not requesting any pain meds. Pt not feeling CTX. No PIH symptoms.   O:  Vitals:   01/16/16 1131 01/16/16 1200 01/16/16 1231 01/16/16 1300  BP: (!) 109/53 112/66 109/68 118/73  Pulse: 94 86 80 85  Resp: 18 20 20 18   Temp: 98.6 F (37 C)     TempSrc: Oral     Weight:      Height:        Dilation: 2 Effacement (%): 60, 70 Cervical Position: Middle Station: -3 Presentation: Vertex Exam by:: Dr. Chales Abrahamsyson, Enis SlipperJane Bailey, RN   FHT: Cat I tra TOCO: q3 minutes   A/P: Will resume pitocin Continue expectant management Anticipate SVD  Deforest HoylesNoah Wallace, DO PGY-3

## 2016-01-16 NOTE — Progress Notes (Signed)
Patient ID: Michelle Navarro, female   DOB: 06/28/1980, 35 y.o.   MRN: [id number] Michelle Navarro is a 35 y.o. [id number] at 155w6d admitted for induction of labor due to Post dates. Due date 11/29.  Subjective: Starting to become uncomfortable w/ uc's, recently got IV pain med. Doesn't want epidural.    Objective: BP 103/74   Pulse 80   Temp 98 F (36.7 C) (Oral)   Resp 20   Ht 5\' 3"  (1.6 m)   Wt 80.4 kg (177 lb 4.8 oz)   LMP 03/29/2015   BMI 31.41 kg/m  No intake/output data recorded.  FHT:  FHR: 155 bpm, variability: moderate,  accelerations:  Present,  decelerations:  Present lates/variables UC:   regular, every 2-4 minutes  SVE:   Dilation: 4.5 Effacement (%): 80 Station: -2 Exam by:: Genella RifeK. Booker, CNM  AROM sm amt clear fluid Pos scalp stim  Pitocin @ 10 mu/min  Labs: Lab Results  Component Value Date   WBC 10.1 01/16/2016   HGB 11.4 (L) 01/16/2016   HCT 33.7 (L) 01/16/2016   MCV 93.1 01/16/2016   PLT 249 01/16/2016    Assessment / Plan: IOL for postdates, lates/variables on pitocin 8510mu/min- good variability, +scalp stim, now AROM'd, RN performing position changes, IVF bolus-will observe for need for IUPC/amnioinfusion  Labor: Progressing normally Fetal Wellbeing:  Category II Pain Control:  IV pain meds Pre-eclampsia: n/a I/D:  n/a Anticipated MOD:  NSVD  Marge DuncansBooker, Kimberly Randall CNM, WHNP-BC 01/16/2016, 11:21 PM

## 2016-01-16 NOTE — H&P (Signed)
LABOR AND DELIVERY ADMISSION HISTORY AND PHYSICAL NOTE  Michelle Navarro is a 35 y.o. female [phone] with IUP atCandise Navarro 729w6d presenting for post-date IOL.    She reports positive fetal movement. She denies leakage of fluid or vaginal bleeding. Reports mild contractions starting this morning. FOB is insistent that no males be allowed in exam room, but patient has no issue with it. FOB became belligerent and was escorted out by security. FOB is allowed back on premises. Safety plan to not enter room alone while FOB is present.    Prenatal History/Complications:  Past Medical History: Past Medical History:  Diagnosis Date  . Abnormal Pap smear   . Anemia   . Anxiety    Zoloft  . Depression    Zoloft and Abilify- currently not taking  . GERD (gastroesophageal reflux disease)   . Hemorrhoid 2017   with current pregnancy; states "uses Vaseline"; denies using medications for them.  . Mental disorder   . Panic attacks     Past Surgical History: Past Surgical History:  Procedure Laterality Date  . DILATION AND CURETTAGE OF UTERUS     2006    Obstetrical History: OB History    Gravida Para Term Preterm AB Living   4 2 2  0 1 2   SAB TAB Ectopic Multiple Live Births   0 1 0 0 2      Social History: Social History   Social History  . Marital status: Single    Spouse name: N/A  . Number of children: N/A  . Years of education: N/A   Social History Main Topics  . Smoking status: Light Tobacco Smoker    Types: Cigarettes  . Smokeless tobacco: Never Used  . Alcohol use No  . Drug use: No  . Sexual activity: Yes   Other Topics Concern  . None   Social History Narrative  . None    Family History: History reviewed. No pertinent family history.  Allergies: No Known Allergies  Prescriptions Prior to Admission  Medication Sig Dispense Refill Last Dose  . Prenatal Vit-Fe Phos-FA-Omega (VITAFOL GUMMIES) 3.33-0.333-34.8 MG CHEW Chew 3 tablets by mouth daily before breakfast. 90  tablet 3 Past Week at Unknown time  . loratadine (CLARITIN) 10 MG tablet Take 1 tablet (10 mg total) by mouth daily. (Patient not taking: Reported on 01/16/2016) 30 tablet prn Not Taking at Unknown time     Review of Systems   All systems reviewed and negative except as stated in HPI  Blood pressure 110/61, pulse 86, temperature 97.7 F (36.5 C), resp. rate 20, height 5\' 3"  (1.6 m), weight 177 lb 4.8 oz (80.4 kg), last menstrual period 03/29/2015, unknown if currently breastfeeding. General appearance: alert, cooperative and no distress Lungs: clear to auscultation bilaterally Heart: regular rate and rhythm Abdomen: soft, non-tender; bowel sounds normal Extremities: No calf swelling or tenderness Presentation: cephalic Fetal monitoring: Category I tracing. Baseline 145. Moderate variability. Accels present, early decels Uterine activity: irregular contractions Dilation: 2 Effacement (%): 60, 70 Station: -3 Exam by:: Dr. Chales Abrahamsyson, Enis SlipperJane Bailey, RN   Prenatal labs: ABO, Rh: --/--/A POS, A POS (12/05 0740) Antibody: NEG (12/05 0740) Rubella: !Error! RPR: Non Reactive (09/07 1140)  HBsAg: Negative (05/31 1619)  HIV: Non Reactive (09/07 1140)  GBS:   pending 1 hr Glucola: wnl at 169 Genetic screening:  declined Anatomy US: 08/24/15  Prenatal Transfer Tool  Maternal Diabetes: No Genetic Screening: Declined Maternal Ultrasounds/Referrals: Normal Fetal Ultrasounds or other Referrals:  None Maternal Substance  Abuse:  Yes:  Type: Smoker Significant Maternal Medications:  None Significant Maternal Lab Results: None  Results for orders placed or performed during the hospital encounter of 01/16/16 (from the past 24 hour(s))  CBC   Collection Time: 01/16/16  7:40 AM  Result Value Ref Range   WBC 10.1 4.0 - 10.5 K/uL   RBC 3.62 (L) 3.87 - 5.11 MIL/uL   Hemoglobin 11.4 (L) 12.0 - 15.0 g/dL   HCT 40.933.7 (L) 81.136.0 - 91.446.0 %   MCV 93.1 78.0 - 100.0 fL   MCH 31.5 26.0 - 34.0 pg   MCHC 33.8  30.0 - 36.0 g/dL   RDW 78.213.8 95.611.5 - 21.315.5 %   Platelets 249 150 - 400 K/uL  Type and screen   Collection Time: 01/16/16  7:40 AM  Result Value Ref Range   ABO/RH(D) A POS    Antibody Screen NEG    Sample Expiration 01/19/2016   ABO/Rh   Collection Time: 01/16/16  7:40 AM  Result Value Ref Range   ABO/RH(D) A POS     Patient Active Problem List   Diagnosis Date Noted  . Post term pregnancy at [redacted] weeks gestation 01/16/2016  . Supervision of normal pregnancy, antepartum 11/03/2015    Assessment: Michelle CheSherrod Navarro is a 35 y.o. [phone] at 4073w6d here for post-date IOL  #Labor: progressing well on low dose pitocin, anticipate NSVD #Pain: Desires IV pain meds, no epidural #FWB: Category 1 tracing #ID:  GBS pending #MOF: Wants to try breast, has previously formula fed #MOC:abstinence, possible interest in BTL  Clearance Cootsndrew Tyson 01/16/2016, 11:35 AM   OB FELLOW HISTORY AND PHYSICAL ATTESTATION  I have seen and examined this patient; I agree with above documentation in the resident's note.    Cherrie Gauzeoah B Wouk 01/16/2016, 1:14 PM

## 2016-01-16 NOTE — Progress Notes (Signed)
I received a referral from pt's nurse due to pt wanting to create an advance directive.  Pt was concerned, in particular, because of her family's disagreements when her mother was sick and ultimately died.  She stated that she did not trust anyone to make decisions for her, especially after seeing how they handled her mother's illness.  She stated that even before this, she was estranged from her family and has very little support in her life.  FOB is somewhat supportive in his own way, but has his own issues; the two do not live together, though she said that he would be helpful with getting her 35 year-old (different FOB)  to the bus over the next weeks and months when she has a newborn.  He also helps her financially.  She is very independent and prefers to be that way, but I encouraged her to reach out for support along the way.  I told her about After-baby groups that she might be able to take part in when her 35 year old is at school.  Her daughter Therapist, art(Justice) was in the room and was doing a good job navigating being in the hospital.  While I was in there, she did call out to Justice's godmother to see if she could pick up Justice for a while.    FOB was not in the room at the time of our talk, at pt's request.  He also alerted Security officer that he would like to speak with a chaplain.  I spoke with him and provided him with scripture at his request.  We will follow up with pt tomorrow and I will bring her the paperwork to complete her advance directive.  She stated that she did not yet know whom she would like to assign.  I let her know that although it is an important document, it was good to take time to consider what she would want the document to say and whom she would assign as her health care proxy.  Chaplain Dyanne CarrelKaty Saranda Legrande, Bcc Pager, [phone] 5:12 PM     01/16/16 1700  Clinical Encounter Type  Visited With Patient  Visit Type Spiritual support;Social support  Referral From Nurse   Spiritual Encounters  Spiritual Needs Emotional

## 2016-01-16 NOTE — Progress Notes (Signed)
Late entry of note due to caring for patient; consults required; acuity of L&D unit. During receiving report this AM, learned that Father of baby was insistent that patient not have any female providers, that he came to the nurses' station with this request as soon as he & patient arrived to L&D for her induction.  Was told that patient has no problem with female providers, and patient affirmed this to this RN.  Hospital Security was made aware, & has name of this visitor. Did not place a support person bracelet on him, & patient in agreement. Patient stated that if her "visitor causes a problem & is made to leave my room or the hospital, that is ok, I don't care". States she wants what is best for her, her baby, & the staff.      Patient stated that he is the father of this baby, but that she & he are not currently in a relationship.  Patient stated that he is only here to watch her four-yr-old daughter. States that she has no one else that can keep her daughter, & stated that she preferred for this daughter to be with her during her entire labor/delivery experience. When this RN asked patient where her oldest daughter is, patient stated, "she is with her father", that her oldest daughter has a different father.       When this RN, the female PA student, Selena BattenKim, and Dr. Chales Abrahamsyson, a female, walked into her room this AM, the father of the baby stated that he "did not want any female doctors in the room, taking care of her". He walked up to the staff, stopping us from further entering the room beyond the sink. Dr. Chales Abrahamsyson, Selena BattenKim, & this RN stated that the patient stated she is fine with female providers caring for her. The patient agreed verbally and nodded her head. Patient remained calm, asking her friend to "calm down, behave, or you will be made to leave", that "you have already acted inappropriately and rude to the staff here since we got here this morning".        This RN immediately called Security & asked them to come to 166  now. Security stated "OK, on our way", & hung up. This RN then opened pt's door & informed staff at nurses' station of situation. Security officer was making rounds, & happened to be outside pt's room.  Asked her to come in, informing her of situation & call for security. Visitor's voice was getting louder, & he was initially resistant to leave. He collected his belongings & left room with Engineer, materialssecurity officer.        This RN called Security desk & asked that he not be allowed to return to L&D, if staff feel uncomfortable with him in room.  Was told that only if patient requests that he not enter her room would that occur. Asked security if the visitor was asked to leave the campus, & was told no, that he was calm & not removed from campus. Patient has stated throughout day that she will let RN know when she is ready for him to return. States she wants to get some rest.  Informed patient to let RN know should she need anything, or if she has any concerns or fears.       This RN placed orders this AM for Social Work consult & Chaplain consult.  Chaplain saw & spent time with patient & pt's daughter today.  Security has called this RN twice this afternoon, stating that the visitor has returned, wants to know if he can come to her room. This RN asked patient first, & she stated she wanted to rest, was not ready yet for him to come back into her room.  Security made visitor aware.  Patient has been calm, appropriate, kind, & appears relaxed all day. Will continue to assess & will inform oncoming shift.

## 2016-01-16 NOTE — Progress Notes (Signed)
Patient ID: Michelle Navarro, female   DOB: 11/13/1980, 35 y.o.   MRN: [id number]  OB Interim Progress Note  S:Patient states she is comfortable at this time with no concerns.  FOB not present in the room. Does not desire epidural at this time, may want some laughing gas if needed.    O: BP 120/80   Pulse 96   Temp 98 F (36.7 C) (Oral)   Resp 20   Ht 5\' 3"  (1.6 m)   Wt 177 lb 4.8 oz (80.4 kg)   LMP 03/29/2015   BMI 31.41 kg/m    Dilation: 4 Effacement (%): 80 Cervical Position: Middle Station: -2 Presentation: Vertex Exam by:: Genella RifeK. Booker, CNM   A/P: Continue expectant management Anticipate SVD Increase Pitocin to 10.  Increase per protocol to achieve adequate labor.   Membranes stripped, AROM declined.  Will continue to monitor.   [name], MD 01/16/2016, 8:44 PM PGY-1

## 2016-01-16 NOTE — Anesthesia Pain Management Evaluation Note (Signed)
  CRNA Pain Management Visit Note  Patient: Michelle Navarro, 35 y.o., female  "Hello I am a [facility]. We have an anesthesia team available at all times to provide care throughout the hospital, including epidural management and anesthesia for C-section. I don't know your plan for the delivery whether it a natural birth, water birth, IV sedation, nitrous supplementation, doula or epidural, but we want to meet your pain goals."   1.Was your pain managed to your expectations on prior hospitalizations?   Yes   2.What is your expectation for pain management during this hospitalization?     Labor support without medications  3.How can we help you reach that goal? Pt has had 2 previous natural births and wishes to do the same thing this time.  Record the patient's initial score and the patient's pain goal.   Pain: 0  Pain Goal: 10 [facility] wants you to be able to say your pain was always managed very well.  Jennie Hannay 01/16/2016

## 2016-01-16 NOTE — H&P (Deleted)
Rennae Orvan FalconerCampbell is a 35 y.o. G4P2  female presenting at 1240+6 for IOL. No complications of pregnancy. No vaginal bleeding or loss of fluid. Reports mild contractions starting this morning. FOB is insistent that no males be allowed in exam room, but patient has no issue with it. FOB became belligerent and was escorted out by security. FOB is allowed back on premises. Safety plan to not enter room alone while FOB is present.   Clinic [facility] Prenatal Labs  Dating  US Blood type: A/Positive/-- (05/31 1619)   Genetic Screen 1 Screen:    AFP:     Quad:     NIPS: Antibody:Negative (05/31 1619)  Anatomic US  08/24/15 Rubella: 5.37 (05/31 1619)  GTT Early:               Third trimester: 93/169/124 RPR: Non Reactive (09/07 1140)   Flu vaccine Declined HBsAg: Negative (05/31 1619)   TDaP vaccine Declined                                     Rhogam: n/a HIV: Non Reactive (09/07 1140)   Baby Food Breast/Bottle                                             GBS: (For PCN allergy, check sensitivities) pending  Contraception undecided Pap:  Circumcision N/a female   Pediatrician [facility]   Support Person FOB      OB History    Gravida Para Term Preterm AB Living   4 2 2  0 1 2   SAB TAB Ectopic Multiple Live Births   0 1 0 0 2     Past Medical History:  Diagnosis Date  . Abnormal Pap smear   . Anemia   . Anxiety    Zoloft  . Depression    Zoloft and Abilify- currently not taking  . GERD (gastroesophageal reflux disease)   . Hemorrhoid 2017   with current pregnancy; states "uses Vaseline"; denies using medications for them.  . Mental disorder   . Panic attacks    Past Surgical History:  Procedure Laterality Date  . DILATION AND CURETTAGE OF UTERUS     2006   Family History: family history is not on file. Social History:  reports that she has smoked cigarettes sporadically during pregnancy, but not recently. She has never used smokeless tobacco. She reports that she does not drink  alcohol or use drugs.     Maternal Diabetes: No Genetic Screening: Normal Maternal Ultrasounds/Referrals: Normal Fetal Ultrasounds or other Referrals:  None Maternal Substance Abuse:  Yes:  Type: Smoker Significant Maternal Medications:  None Significant Maternal Lab Results:  None Other Comments:  None  ROS Maternal Medical History:  Contractions: Onset was less than 1 hour ago.   Frequency: regular.   Perceived severity is mild.      Dilation: 2 Effacement (%): 60, 70 Station: -3 Exam by:: Dr. Chales Abrahamsyson, Enis SlipperJane Bailey, RN   Blood pressure 114/74, pulse 92, temperature 97.7 F (36.5 C), resp. rate 20, height 5\' 3"  (1.6 m), weight 177 lb 4.8 oz (80.4 kg), last menstrual period 03/29/2015, unknown if currently breastfeeding. Maternal Exam:  Cervix: Cervix evaluated by digital exam.     Fetal Exam Fetal Monitor Review: Baseline rate: 145.  Variability: moderate (  6-25 bpm).   Pattern: accelerations present and early decelerations.    Fetal State Assessment: Category I - tracings are normal.     Physical Exam  Constitutional: She is oriented to person, place, and time. She appears well-developed and well-nourished.  Respiratory: Effort normal.  Musculoskeletal: Normal range of motion.  Neurological: She is alert and oriented to person, place, and time.  Psychiatric: She has a normal mood and affect. Her behavior is normal. Judgment and thought content normal.    Prenatal labs: ABO, Rh: --/--/A POS, A POS (12/05 0740) Antibody: NEG (12/05 0740) Rubella: 5.37 (05/31 1619) RPR: Non Reactive (09/07 1140)  HBsAg: Negative (05/31 1619)  HIV: Non Reactive (09/07 1140)  GBS:   pending  Assessment/Plan: Candise CheSherrod Busler is a 35 y.o. G4P2  female presenting at 40+6 for IOL due to being post-date. We will start low dose pitocin and let her eat a low laboring diet. F/u GBS status  Clearance Cootsndrew Archita Lomeli 01/16/2016, 10:16 AM

## 2016-01-16 NOTE — Progress Notes (Signed)
Michelle Navarro is a 35 y.o. [id number] at 655w6d admitted for induction of labor due to Post dates.  Subjective: Patient comfortable. Reports mild contractions. Is currently at 8 milli-units/min of pitocin.    Objective: BP (!) 108/57 (BP Location: Left Arm)   Pulse 81   Temp 98.5 F (36.9 C) (Oral)   Resp 18   Ht 5\' 3"  (1.6 m)   Wt 177 lb 4.8 oz (80.4 kg)   LMP 03/29/2015   BMI 31.41 kg/m  No intake/output data recorded. No intake/output data recorded.  FHT:  FHR: 155 bpm, variability: moderate,  accelerations:  Present,  decelerations:  Absent UC:   irregular, every 2-10 minutes SVE:   Dilation: 3.5 Effacement (%): 100 Station: -3 Exam by:: Dr. Earlene PlaterWallace  Labs: Lab Results  Component Value Date   WBC 10.1 01/16/2016   HGB 11.4 (L) 01/16/2016   HCT 33.7 (L) 01/16/2016   MCV 93.1 01/16/2016   PLT 249 01/16/2016    Assessment / Plan: Induction of labor due to postterm,  progressing well on pitocin  Labor: Progressing on Pitocin, will continue to increase then AROM Fetal Wellbeing:  Category I Pain Control:  IV pain meds Anticipated MOD:  NSVD  Clearance Cootsndrew Beena Catano 01/16/2016, 5:42 PM

## 2016-01-17 ENCOUNTER — Encounter (HOSPITAL_COMMUNITY): Payer: Self-pay

## 2016-01-17 LAB — STREP GP B NAA: STREP GROUP B AG: NEGATIVE

## 2016-01-17 MED ORDER — IBUPROFEN 600 MG PO TABS
600.0000 mg | ORAL_TABLET | Freq: Four times a day (QID) | ORAL | Status: DC
  Administered 2016-01-17 (×3): 600 mg via ORAL
  Filled 2016-01-17 (×3): qty 1

## 2016-01-17 MED ORDER — BENZOCAINE-MENTHOL 20-0.5 % EX AERO
1.0000 "application " | INHALATION_SPRAY | CUTANEOUS | Status: DC | PRN
  Administered 2016-01-17: 1 via TOPICAL
  Filled 2016-01-17: qty 56

## 2016-01-17 MED ORDER — DIPHENHYDRAMINE HCL 25 MG PO CAPS: 25.0000 mg | ORAL_CAPSULE | Freq: Four times a day (QID) | ORAL | Status: DC | PRN

## 2016-01-17 MED ORDER — TETANUS-DIPHTH-ACELL PERTUSSIS 5-2.5-18.5 LF-MCG/0.5 IM SUSP: 0.5000 mL | Freq: Once | INTRAMUSCULAR | Status: DC

## 2016-01-17 MED ORDER — ONDANSETRON HCL 4 MG PO TABS: 4.0000 mg | ORAL_TABLET | ORAL | Status: DC | PRN

## 2016-01-17 MED ORDER — KETOROLAC TROMETHAMINE 60 MG/2ML IM SOLN
60.0000 mg | Freq: Once | INTRAMUSCULAR | Status: AC
  Administered 2016-01-17: 60 mg via INTRAMUSCULAR
  Filled 2016-01-17: qty 2

## 2016-01-17 MED ORDER — WITCH HAZEL-GLYCERIN EX PADS
1.0000 "application " | MEDICATED_PAD | CUTANEOUS | Status: DC | PRN
  Administered 2016-01-17: 1 via TOPICAL

## 2016-01-17 MED ORDER — PRENATAL MULTIVITAMIN CH
1.0000 | ORAL_TABLET | Freq: Every day | ORAL | Status: DC
  Administered 2016-01-17 – 2016-01-18 (×2): 1 via ORAL
  Filled 2016-01-17 (×2): qty 1

## 2016-01-17 MED ORDER — ONDANSETRON HCL 4 MG/2ML IJ SOLN: 4.0000 mg | INTRAMUSCULAR | Status: DC | PRN

## 2016-01-17 MED ORDER — DIBUCAINE 1 % RE OINT: 1.0000 "application " | TOPICAL_OINTMENT | RECTAL | Status: DC | PRN

## 2016-01-17 MED ORDER — SENNOSIDES-DOCUSATE SODIUM 8.6-50 MG PO TABS
2.0000 | ORAL_TABLET | ORAL | Status: DC
  Administered 2016-01-17: 2 via ORAL
  Filled 2016-01-17: qty 2

## 2016-01-17 MED ORDER — COCONUT OIL OIL: 1.0000 "application " | TOPICAL_OIL | Status: DC | PRN

## 2016-01-17 MED ORDER — SIMETHICONE 80 MG PO CHEW: 80.0000 mg | CHEWABLE_TABLET | ORAL | Status: DC | PRN

## 2016-01-17 MED ORDER — ZOLPIDEM TARTRATE 5 MG PO TABS: 5.0000 mg | ORAL_TABLET | Freq: Every evening | ORAL | Status: DC | PRN

## 2016-01-17 MED ORDER — ACETAMINOPHEN 325 MG PO TABS
650.0000 mg | ORAL_TABLET | ORAL | Status: DC | PRN
  Administered 2016-01-17 – 2016-01-18 (×6): 650 mg via ORAL
  Filled 2016-01-17 (×7): qty 2

## 2016-01-17 NOTE — Progress Notes (Signed)
Post Partum Day #0 Subjective: no complaints, up ad lib, voiding and tolerating PO  Objective: Blood pressure 103/71, pulse 83, temperature 98 F (36.7 C), temperature source Oral, resp. rate 18, height 5\' 3"  (1.6 m), weight 177 lb 4.8 oz (80.4 kg), last menstrual period 03/29/2015, unknown if currently breastfeeding.  Physical Exam:  General: alert, cooperative and no distress Lochia: appropriate Uterine Fundus: firm Incision: none DVT Evaluation: No evidence of DVT seen on physical exam. No cords or calf tenderness. No significant calf/ankle edema.   Recent Labs  01/16/16 0740  HGB 11.4*  HCT 33.7*    Assessment/Plan: Breastfeeding, Social Work consult and Contraception abstinence   LOS: 1 day   Roe CoombsRachelle A Neytiri Asche, CNM 01/17/2016, 8:21 AM

## 2016-01-17 NOTE — Progress Notes (Signed)
Resident notified regarding pain of 10. No new orders at this time.
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
## 2016-01-17 NOTE — Progress Notes (Signed)
I offered follow up support to Michelle Navarro and Michelle Navarro as well.  Michelle Navarro is grateful that her baby is here.  I brought her the information on Advance Directives that we talked about yesterday and I brought her information on support groups for new mothers here at the hospital.  I am still concerned about her lack of support.  I gave her my card for follow-up.  Chaplain Dyanne CarrelKaty Alby Schwabe, Bcc Pager, [phone] 3:58 PM    01/17/16 1500  Clinical Encounter Type  Visited With Patient and family together  Visit Type Follow-up;Spiritual support

## 2016-01-17 NOTE — Progress Notes (Signed)
Patient ID: Candise CheSherrod Vankleeck, female   DOB: 10/18/1980, 35 y.o.   MRN: [id number]  Patient is complaining of 7/10 constant cramping pain in her abdomen. The patient states it feels similar to but slightly worse than her normal menstrual cramps. Her pain has been mildly decreased with tylenol and ibuprofen. Her last dose of ibuprofen was at 11:01 today. Patient has no abdominal tenderness, guarding, or rebound tenderness. The pain does not radiate, and is not in her vagina. Patient does not complain of vaginal bleeding.   Assessment & Plan 1. Pain likely due to uterine involution after delivery - Patient given Toradol 60mg  IM once at 17:15.  - Reassess pain intermittently.  - Continue Tylenol PRN.    Gala MurdochKimberly Antoninette Lerner PA-S2  01/17/2016 16:30

## 2016-01-17 NOTE — Lactation Note (Signed)
This note was copied from a baby's chart. Lactation Consultation Note  Patient Name: Michelle Navarro Today's Date: 01/17/2016 Reason for consult: Initial assessment Baby at 22 hr of life. Mom stated she tried to bf but the baby's gums felt like teeth on her nipples. Discussed getting a proper latch and pumping. Mom stated she did not bf her other 2 children because her breast were heavy and sore before she very gave birth. She would like to try bf again with this baby at the next feeding. She would like to try pumping if latching does not work out. Explained supply and demand. She will call for lactation help as needed. Discussed baby behavior, feeding frequency, baby belly size, voids, wt loss, breast changes, and nipple care. Given lactation handouts. Aware of OP services and support group.   Maternal Data    Feeding Feeding Type: Bottle Fed - Formula Nipple Type: Slow - flow  LATCH Score/Interventions                      Lactation Tools Discussed/Used     Consult Status Consult Status: PRN    Michelle Navarro 01/17/2016, 10:50 PM

## 2016-01-18 MED ORDER — OXYCODONE-ACETAMINOPHEN 5-325 MG PO TABS
1.0000 | ORAL_TABLET | ORAL | Status: DC | PRN
  Administered 2016-01-18: 1 via ORAL
  Filled 2016-01-18: qty 1

## 2016-01-18 MED ORDER — OXYCODONE-ACETAMINOPHEN 5-325 MG PO TABS: 1.0000 | ORAL_TABLET | ORAL | 0 refills | Status: AC | PRN

## 2016-01-18 MED ORDER — IBUPROFEN 800 MG PO TABS: 800.0000 mg | ORAL_TABLET | Freq: Three times a day (TID) | ORAL | 0 refills | Status: AC | PRN

## 2016-01-18 MED ORDER — OXYCODONE-ACETAMINOPHEN 5-325 MG PO TABS: 2.0000 | ORAL_TABLET | ORAL | Status: DC | PRN
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
## 2016-01-18 NOTE — Discharge Summary (Signed)
Obstetric Discharge Summary Reason for Admission: induction of labor and for postdates, [id number] Prenatal Procedures: NST and ultrasound Intrapartum Procedures: spontaneous vaginal delivery Postpartum Procedures: none Complications-Operative and Postpartum: none Hemoglobin  Date Value Ref Range Status  01/16/2016 11.4 (L) 12.0 - 15.0 g/dL Final   HCT  Date Value Ref Range Status  01/16/2016 33.7 (L) 36.0 - 46.0 % Final   Hematocrit  Date Value Ref Range Status  10/19/2015 34.8 34.0 - 46.6 % Final    Physical Exam:  General: alert, cooperative and no distress Lochia: appropriate Uterine Fundus: firm Incision: none DVT Evaluation: No evidence of DVT seen on physical exam. No cords or calf tenderness. No significant calf/ankle edema.  Discharge Diagnoses: Post-date pregnancy and delivered  Discharge Information: Date: 01/18/2016 Activity: pelvic rest Diet: routine Medications: PNV, Ibuprofen and Percocet Condition: stable Instructions: refer to practice specific booklet Discharge to: home Follow-up Information    Gwendlyon Zumbro A Ayaz Sondgeroth, CNM Follow up in 4 week(s).   Specialty:  Certified Nurse Midwife Why:  Postpartum exam.  Contact information: [address] [place] [id number] [phone]           Newborn Data: Live born female  Birth Weight: 7 lb 5.8 oz (3340 g) APGAR: 8, 9  Home with mother.  Roe Coombsachelle A Kouper Spinella, CNM 01/18/2016, 7:53 AM

## 2016-01-18 NOTE — Lactation Note (Signed)
This note was copied from a baby's chart. Lactation Consultation Note: Mother has been mostly bottle feeding. Mother states she wants to try and breastfeed. She states she is busy now and request I come back later. Mother informed that we would like for her to page for Lactation to come when she is ready.   Patient Name: Girl Candise CheSherrod Diver ZOXWR'UToday's Date: 01/18/2016     Maternal Data    Feeding Feeding Type: Bottle Fed - Formula  LATCH Score/Interventions                      Lactation Tools Discussed/Used     Consult Status      Michel BickersKendrick, Theodore Virgin McCoy 01/18/2016, 11:56 AM

## 2016-01-18 NOTE — Plan of Care (Signed)
Problem: Activity: Goal: Risk for activity intolerance will decrease Outcome: Completed/Met Date Met: 01/18/16 Pt up independently in room - rests appropriately.   Problem: Nutrition: Goal: Adequate nutrition will be maintained Outcome: Completed/Met Date Met: 01/18/16 Pt able to tolerate regular diet without nausea or vomiting. No consult required.

## 2016-01-18 NOTE — Plan of Care (Signed)
Problem: Spiritual Needs Goal: Ability to function at adequate level Outcome: Progressing FOB read bible to daughter in room. Discuss Bible stories, reading, etc.   Problem: Pain Managment: Goal: General experience of comfort will improve Outcome: Progressing Pt given 66m toradol IM prior to shift. Prn tylenol requested by patient to be given q4h. Pain remains at tolerable level with prn meds.  Problem: Skin Integrity: Goal: Risk for impaired skin integrity will decrease Outcome: Completed/Met Date Met: 01/18/16 Pt able to change peri pads as needed. Pt able to ambulate independantly in room and adjust position in bed as needed. No other open areas noted.

## 2016-01-18 NOTE — Progress Notes (Signed)
Post Partum Day #1 Subjective: no complaints, up ad lib, voiding and tolerating PO  Objective: Blood pressure (!) 96/50, pulse 93, temperature 98.1 F (36.7 C), temperature source Oral, resp. rate 18, height 5\' 3"  (1.6 m), weight 177 lb 4.8 oz (80.4 kg), last menstrual period 03/29/2015, SpO2 100 %, unknown if currently breastfeeding.  Physical Exam:  General: alert, cooperative and no distress Lochia: appropriate Uterine Fundus: firm Incision: none DVT Evaluation: No evidence of DVT seen on physical exam. No cords or calf tenderness. No significant calf/ankle edema.   Recent Labs  01/16/16 0740  HGB 11.4*  HCT 33.7*    Assessment/Plan: Discharge home, Breastfeeding, Social Work consult and Contraception abstinence   LOS: 2 days   Roe CoombsRachelle A Denney, CNM 01/18/2016, 7:51 AM
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
## 2017-09-30 IMAGING — DX DG ABDOMEN ACUTE W/ 1V CHEST
3 series · 3 of 3 positions shown · non-contrast
Comparison: None.

CLINICAL DATA: Chest pain with tightness.  Constipation

EXAM:
DG ABDOMEN ACUTE W/ 1V CHEST

[w chest pa]
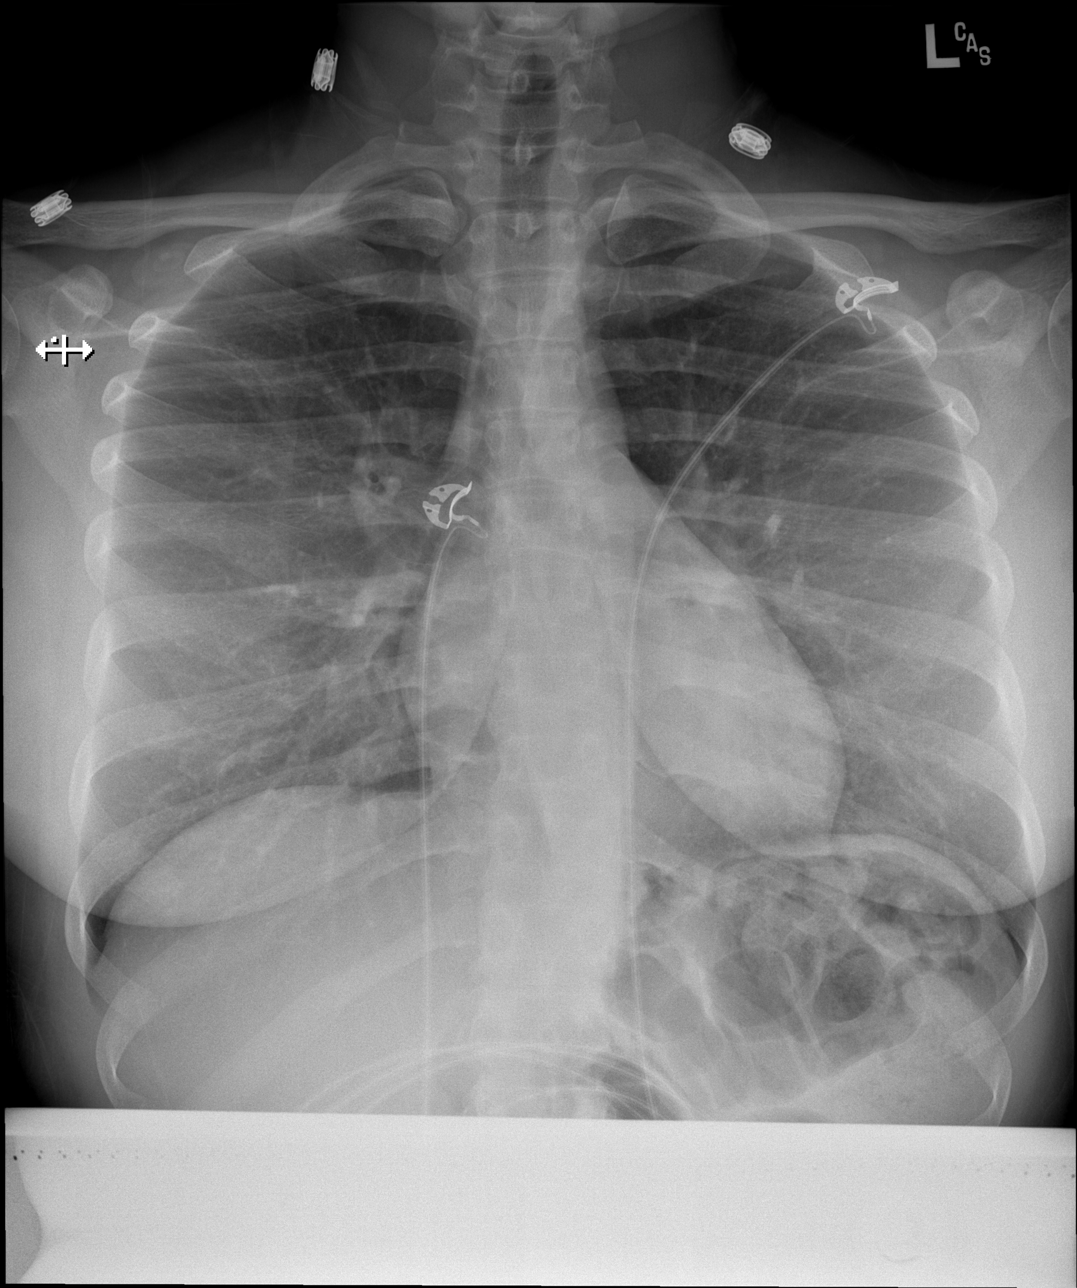

[w abdomen upright]
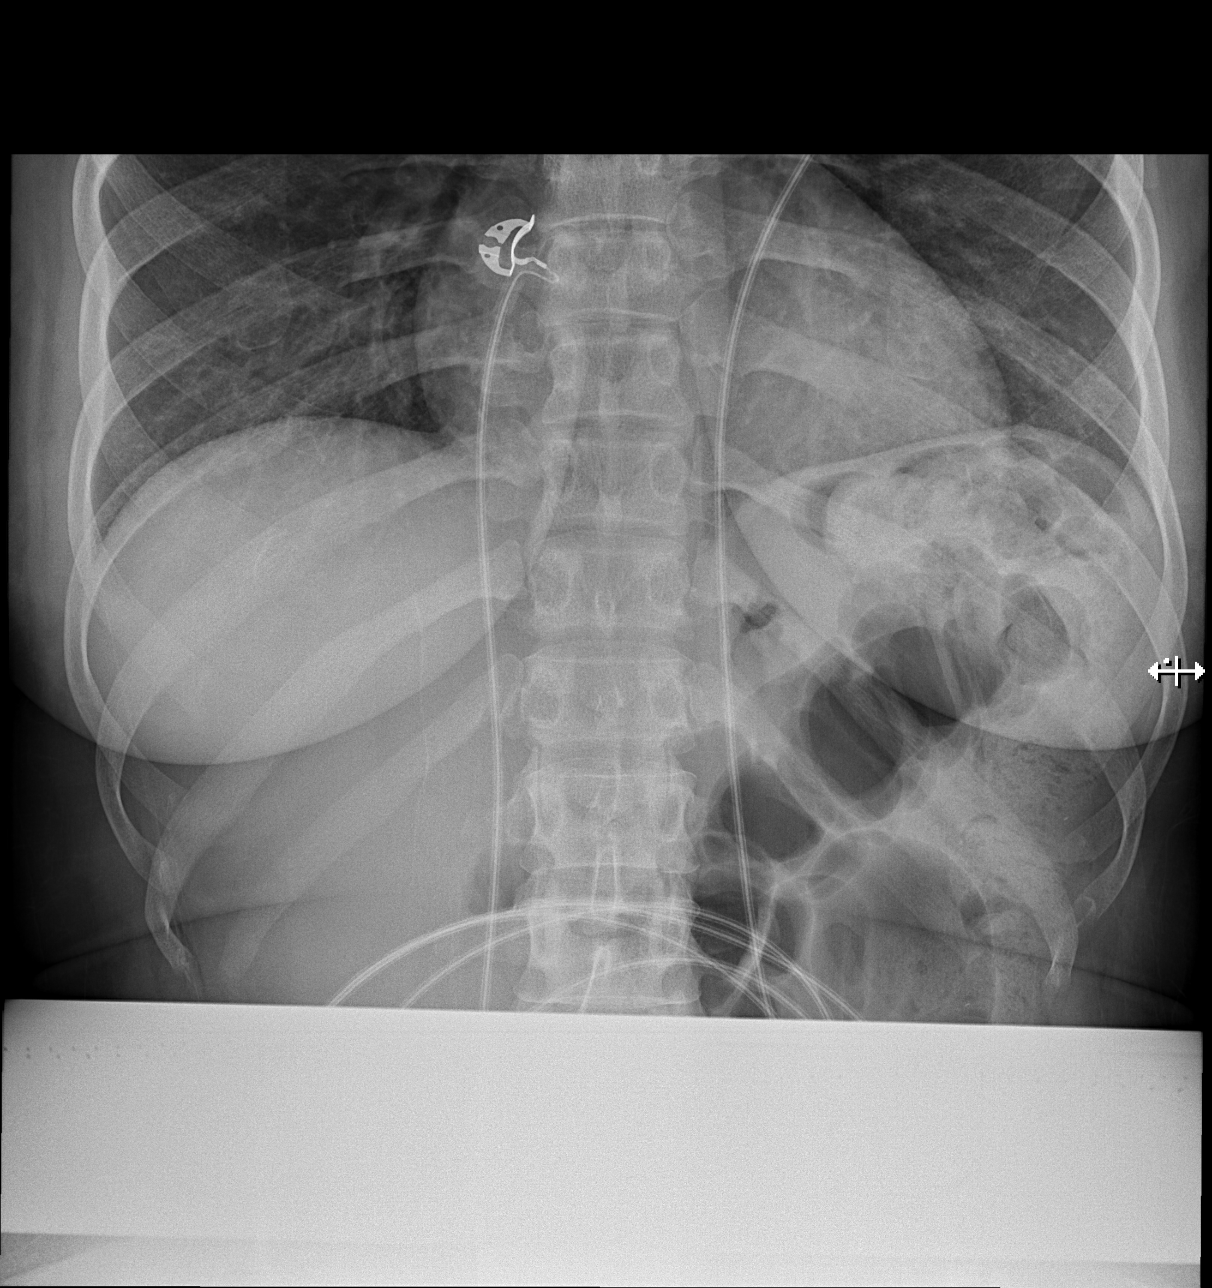

[t abdomen supine]
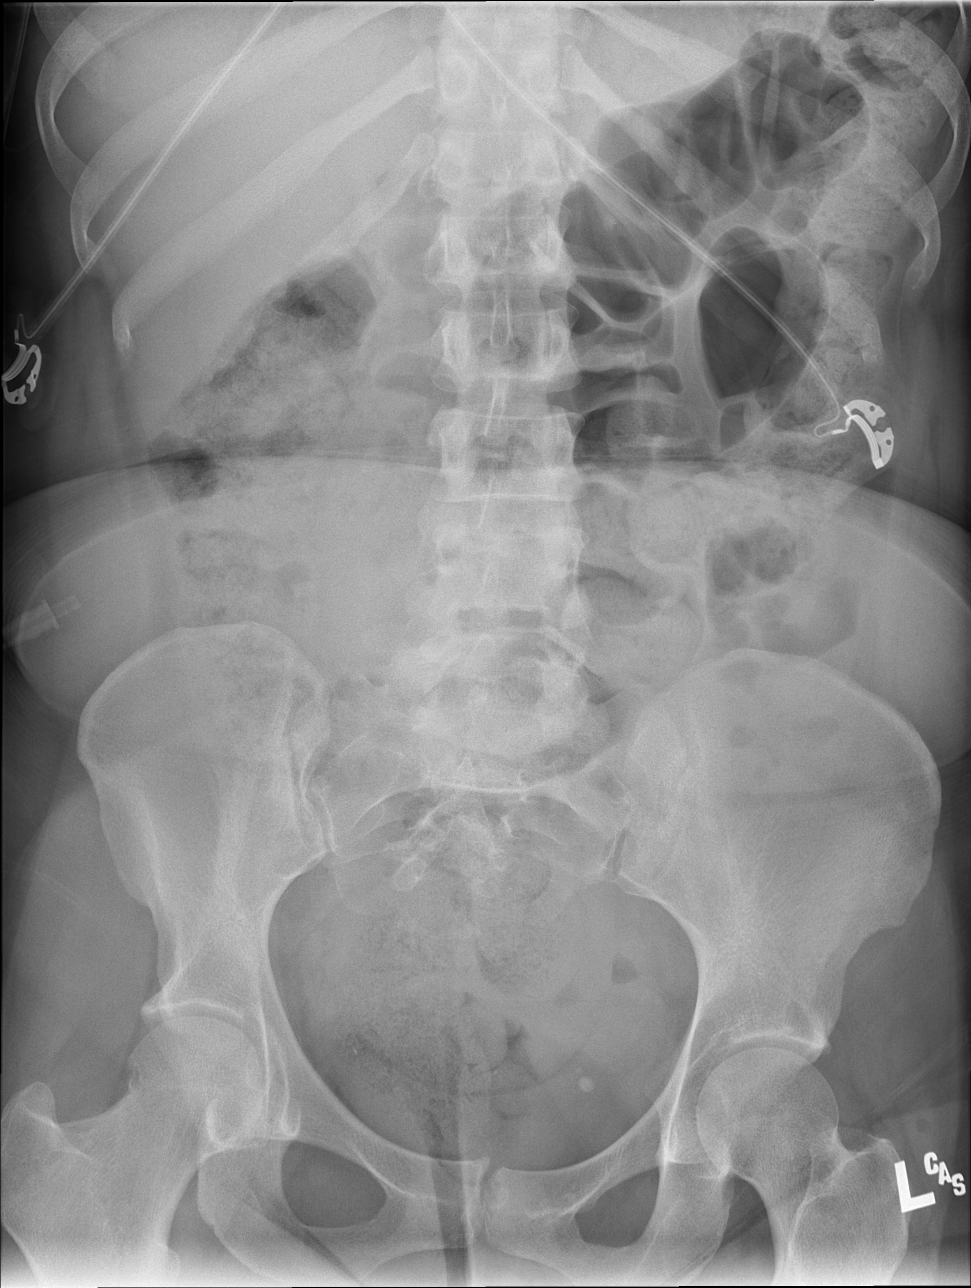

[3 of 3 positions shown; findings below may reference images not displayed]

FINDINGS: Large stool volume correlating with the history. No obstruction or
impaction. No pneumatosis or pneumoperitoneum.

Normal heart size and mediastinal contours. No acute infiltrate or
edema. No effusion or pneumothorax. No acute osseous findings.
IMPRESSION: 1. Prominent stool volume correlating with history of constipation.
No obstruction or impaction.
2. Clear chest.

## 2018-07-16 ENCOUNTER — Other Ambulatory Visit: Payer: Self-pay

## 2018-07-16 ENCOUNTER — Encounter (HOSPITAL_COMMUNITY): Payer: Self-pay | Admitting: Emergency Medicine

## 2018-07-16 ENCOUNTER — Emergency Department (HOSPITAL_COMMUNITY)
Admission: EM | Admit: 2018-07-16 | Discharge: 2018-07-16 | Disposition: A | Discharge status: Home / Self Care | Payer: No Typology Code available for payment source | Attending: Emergency Medicine | Admitting: Emergency Medicine

## 2018-07-16 DIAGNOSIS — M7918 Myalgia, other site: Secondary | ICD-10-CM

## 2018-07-16 DIAGNOSIS — M545 Low back pain: Secondary | ICD-10-CM | POA: Insufficient documentation

## 2018-07-16 DIAGNOSIS — F17210 Nicotine dependence, cigarettes, uncomplicated: Secondary | ICD-10-CM | POA: Diagnosis not present

## 2018-07-16 DIAGNOSIS — Y999 Unspecified external cause status: Secondary | ICD-10-CM | POA: Insufficient documentation

## 2018-07-16 DIAGNOSIS — Y939 Activity, unspecified: Secondary | ICD-10-CM | POA: Diagnosis not present

## 2018-07-16 DIAGNOSIS — Y929 Unspecified place or not applicable: Secondary | ICD-10-CM | POA: Insufficient documentation

## 2018-07-16 DIAGNOSIS — M542 Cervicalgia: Secondary | ICD-10-CM | POA: Diagnosis not present

## 2018-07-16 DIAGNOSIS — M79652 Pain in left thigh: Secondary | ICD-10-CM | POA: Diagnosis not present

## 2018-07-16 MED ORDER — CYCLOBENZAPRINE HCL 10 MG PO TABS: 10.0000 mg | ORAL_TABLET | Freq: Two times a day (BID) | ORAL | 0 refills | Status: AC | PRN
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
## 2018-07-16 NOTE — ED Notes (Signed)
Patient verbalizes understanding of discharge instructions. Opportunity for questioning and answers were provided. Armband removed by staff, pt discharged from ED.  

## 2018-07-16 NOTE — Discharge Instructions (Addendum)
Please read attached information. If you experience any new or worsening signs or symptoms please return to the emergency room for evaluation. Please follow-up with your primary care provider or specialist as discussed. Please use medication prescribed only as directed and discontinue taking if you have any concerning signs or symptoms.   °

## 2022-02-16 ENCOUNTER — Emergency Department (HOSPITAL_COMMUNITY): Payer: BLUE CROSS/BLUE SHIELD

## 2022-02-16 ENCOUNTER — Other Ambulatory Visit: Payer: Self-pay

## 2022-02-16 ENCOUNTER — Emergency Department (HOSPITAL_COMMUNITY)
Admission: EM | Admit: 2022-02-16 | Discharge: 2022-02-17 | Disposition: A | Discharge status: Home / Self Care | Payer: BLUE CROSS/BLUE SHIELD | Attending: Emergency Medicine | Admitting: Emergency Medicine

## 2022-02-16 DIAGNOSIS — J101 Influenza due to other identified influenza virus with other respiratory manifestations: Secondary | ICD-10-CM | POA: Diagnosis not present

## 2022-02-16 DIAGNOSIS — J9801 Acute bronchospasm: Secondary | ICD-10-CM | POA: Diagnosis not present

## 2022-02-16 DIAGNOSIS — F17210 Nicotine dependence, cigarettes, uncomplicated: Secondary | ICD-10-CM | POA: Diagnosis not present

## 2022-02-16 DIAGNOSIS — E871 Hypo-osmolality and hyponatremia: Secondary | ICD-10-CM | POA: Insufficient documentation

## 2022-02-16 DIAGNOSIS — R7309 Other abnormal glucose: Secondary | ICD-10-CM | POA: Diagnosis not present

## 2022-02-16 DIAGNOSIS — Z20822 Contact with and (suspected) exposure to covid-19: Secondary | ICD-10-CM | POA: Diagnosis not present

## 2022-02-16 DIAGNOSIS — R059 Cough, unspecified: Secondary | ICD-10-CM | POA: Diagnosis present

## 2022-02-16 LAB — CBC WITH DIFFERENTIAL/PLATELET
Abs Immature Granulocytes: 0.05 10*3/uL (ref 0.00–0.07)
Basophils Absolute: 0 10*3/uL (ref 0.0–0.1)
Basophils Relative: 0 %
Eosinophils Absolute: 0 10*3/uL (ref 0.0–0.5)
Eosinophils Relative: 0 %
HCT: 41.9 % (ref 36.0–46.0)
Hemoglobin: 13.6 g/dL (ref 12.0–15.0)
Immature Granulocytes: 0 %
Lymphocytes Relative: 8 %
Lymphs Abs: 1.1 10*3/uL (ref 0.7–4.0)
MCH: 31.6 pg (ref 26.0–34.0)
MCHC: 32.5 g/dL (ref 30.0–36.0)
MCV: 97.4 fL (ref 80.0–100.0)
Monocytes Absolute: 1.3 10*3/uL — ABNORMAL HIGH (ref 0.1–1.0)
Monocytes Relative: 10 %
Neutro Abs: 10.7 10*3/uL — ABNORMAL HIGH (ref 1.7–7.7)
Neutrophils Relative %: 82 %
Platelets: 257 10*3/uL (ref 150–400)
RBC: 4.3 MIL/uL (ref 3.87–5.11)
RDW: 12.5 % (ref 11.5–15.5)
WBC: 13.1 10*3/uL — ABNORMAL HIGH (ref 4.0–10.5)
nRBC: 0 % (ref 0.0–0.2)

## 2022-02-16 LAB — BASIC METABOLIC PANEL
Anion gap: 11 (ref 5–15)
BUN: 6 mg/dL (ref 6–20)
CO2: 21 mmol/L — ABNORMAL LOW (ref 22–32)
Calcium: 8.7 mg/dL — ABNORMAL LOW (ref 8.9–10.3)
Chloride: 100 mmol/L (ref 98–111)
Creatinine, Ser: 0.69 mg/dL (ref 0.44–1.00)
GFR, Estimated: >60 mL/min (ref >60)
Glucose, Bld: 115 mg/dL — ABNORMAL HIGH (ref 70–99)
Potassium: 3.9 mmol/L (ref 3.5–5.1)
Sodium: 132 mmol/L — ABNORMAL LOW (ref 135–145)

## 2022-02-16 LAB — RESP PANEL BY RT-PCR (RSV, FLU A&B, COVID)  RVPGX2
Influenza A by PCR: NEGATIVE
Influenza B by PCR: POSITIVE — AB
Resp Syncytial Virus by PCR: NEGATIVE
SARS Coronavirus 2 by RT PCR: NEGATIVE

## 2022-02-16 MED ORDER — ACETAMINOPHEN 500 MG PO TABS
1000.0000 mg | ORAL_TABLET | Freq: Once | ORAL | Status: AC
  Administered 2022-02-16: 1000 mg via ORAL
  Filled 2022-02-16: qty 2

## 2022-02-16 NOTE — ED Provider Triage Note (Signed)
Emergency Medicine Provider Triage Evaluation Note  Michelle Navarro , a 42 y.o. female  was evaluated in triage.  Pt complains of productive cough, shortness of breath, chest congestion, and sinus headache over the last week.  Daughter was diagnosed with strep throat 1 to 2 days before her symptoms started.  Feels fatigued and dehydrated.  Has not taken anything for headache since yesterday, requesting pain medicine.  Decreased appetite.  Denies neck stiffness, N/V/D, abdominal pain or chest pain.  Hx of anxiety, panic attacks, depression, occasional tobacco use.  Review of Systems  Positive:  Negative: See above  Physical Exam  LMP 02/01/2022  Gen:   Awake, no distress, appears uncomfortable Resp:  Normal effort, moving air adequately MSK:   Moves extremities without difficulty  Other:  Chest non-TTP.  Productive cough appreciated.  Appears mildly clinically dehydrated.  Medical Decision Making  Medically screening exam initiated at 7:45 PM.  Appropriate orders placed.  Michelle Navarro was informed that the remainder of the evaluation will be completed by another provider, this initial triage assessment does not replace that evaluation, and the importance of remaining in the ED until their evaluation is complete.     Prince Rome, PA-C 76/19/50 1950

## 2022-02-16 NOTE — ED Triage Notes (Signed)
Patient reports productive cough with chest congestion /SOB and headache onset this week .

## 2022-02-17 MED ORDER — ALBUTEROL SULFATE HFA 108 (90 BASE) MCG/ACT IN AERS
2.0000 | INHALATION_SPRAY | RESPIRATORY_TRACT | Status: DC | PRN
  Filled 2022-02-17: qty 6.7

## 2022-02-17 MED ORDER — PREDNISONE 20 MG PO TABS
60.0000 mg | ORAL_TABLET | Freq: Once | ORAL | Status: AC
  Administered 2022-02-17: 60 mg via ORAL
  Filled 2022-02-17: qty 3

## 2022-02-17 MED ORDER — PREDNISONE 50 MG PO TABS: 50.0000 mg | ORAL_TABLET | Freq: Every day | ORAL | 0 refills | Status: AC

## 2022-02-17 MED ORDER — IPRATROPIUM-ALBUTEROL 0.5-2.5 (3) MG/3ML IN SOLN
3.0000 mL | Freq: Once | RESPIRATORY_TRACT | Status: AC
  Administered 2022-02-17: 3 mL via RESPIRATORY_TRACT
  Filled 2022-02-17: qty 3

## 2022-02-17 NOTE — ED Provider Notes (Signed)
[facility] EMERGENCY DEPARTMENT Provider Note   CSN: [id number] Arrival date & time: 02/16/22  1820     History  Chief Complaint  Patient presents with   Cough / Chest Congestion     Michelle Navarro is a 42 y.o. female.  The history is provided by the patient.  She had onset 3 days ago of cough, chest and nasal congestion, shortness of breath.  Cough is productive of brownish sputum.  She denies fever but has had chills.  She denies sweats.  She denies arthralgias or myalgias.  She had been exposed to strep throat but nobody with respiratory illness.  She is a cigarette smoker.   Home Medications Prior to Admission medications   Medication Sig Start Date End Date Taking? Authorizing Provider  cyclobenzaprine (FLEXERIL) 10 MG tablet Take 1 tablet (10 mg total) by mouth 2 (two) times daily as needed for muscle spasms. 07/16/18   Hedges, Tinnie Gens, PA-C  ibuprofen (ADVIL,MOTRIN) 800 MG tablet Take 1 tablet (800 mg total) by mouth every 8 (eight) hours as needed. 01/18/16   Orvilla Cornwall A, CNM  loratadine (CLARITIN) 10 MG tablet Take 1 tablet (10 mg total) by mouth daily. Patient not taking: Reported on 01/16/2016 11/30/15   Brock Bad, MD  oxyCODONE-acetaminophen (PERCOCET/ROXICET) 5-325 MG tablet Take 1-2 tablets by mouth every 4 (four) hours as needed for severe pain (pain scale >7). 01/18/16   Roe Coombs, CNM  Prenatal Vit-Fe Phos-FA-Omega (VITAFOL GUMMIES) 3.33-0.333-34.8 MG CHEW Chew 3 tablets by mouth daily before breakfast. 09/21/15   Brock Bad, MD      Allergies    Patient has no known allergies.    Review of Systems   Review of Systems  All other systems reviewed and are negative.   Physical Exam Updated Vital Signs BP 116/79   Pulse 83   Temp 98.1 F (36.7 C) (Oral)   Resp 16   LMP 02/01/2022   SpO2 97%  Physical Exam Vitals and nursing note reviewed.   42 year old female, resting comfortably and in no acute distress.  Vital signs are normal. Oxygen saturation is 97%, which is normal. Head is normocephalic and atraumatic. PERRLA, EOMI. Oropharynx is clear. Neck is nontender and supple without adenopathy or JVD. Back is nontender and there is no CVA tenderness. Lungs have scattered inspiratory and expiratory wheezes with prolonged exhalation phase.  There are no rales or rhonchi. Chest is nontender. Heart has regular rate and rhythm without murmur. Abdomen is soft, flat, nontender. Extremities have no cyanosis or edema, full range of motion is present. Skin is warm and dry without rash. Neurologic: Mental status is normal, cranial nerves are intact, moves all extremities equally.  ED Results / Procedures / Treatments   Labs (all labs ordered are listed, but only abnormal results are displayed) Labs Reviewed  RESP PANEL BY RT-PCR (RSV, FLU A&B, COVID)  RVPGX2 - Abnormal; Notable for the following components:      Result Value   Influenza B by PCR POSITIVE (*)    All other components within normal limits  BASIC METABOLIC PANEL - Abnormal; Notable for the following components:   Sodium 132 (*)    CO2 21 (*)    Glucose, Bld 115 (*)    Calcium 8.7 (*)    All other components within normal limits  CBC WITH DIFFERENTIAL/PLATELET - Abnormal; Notable for the following components:   WBC 13.1 (*)    Neutro Abs 10.7 (*)  Monocytes Absolute 1.3 (*)    All other components within normal limits    EKG None  Radiology DG Chest 2 View  Result Date: 02/16/2022 CLINICAL DATA:  Shortness of breath. EXAM: CHEST - 2 VIEW COMPARISON:  None Available. FINDINGS: The heart size and mediastinal contours are within normal limits. Right middle lobe opacity concerning for pneumonia. No acute osseous abnormality. IMPRESSION: Right middle lobe opacity concerning for pneumonia. Follow-up examination to resolution is recommended. Electronically Signed   By: Keane Police D.O.   On: 02/16/2022 20:28    Procedures Procedures     Medications Ordered in ED Medications  albuterol (VENTOLIN HFA) 108 (90 Base) MCG/ACT inhaler 2 puff (has no administration in time range)  acetaminophen (TYLENOL) tablet 1,000 mg (1,000 mg Oral Given 02/16/22 1954)  ipratropium-albuterol (DUONEB) 0.5-2.5 (3) MG/3ML nebulizer solution 3 mL (3 mLs Nebulization Given 02/17/22 0107)  predniSONE (DELTASONE) tablet 60 mg (60 mg Oral Given 02/17/22 0106)    ED Course/ Medical Decision Making/ A&P                           Medical Decision Making Risk Prescription drug management.   Respiratory tract infection with bronchospasm.  Consider pneumonia, acute bronchitis, viral infection such as influenza, RSV, COVID-19.  I have reviewed and interpreted her laboratory test, and my interpretation is mild hyponatremia which is not clinically significant, mildly elevated random glucose which will need to be followed as an outpatient, mild leukocytosis with left shift, which is nonspecific.  Respiratory pathogen panel is positive for influenza B.  Influenza B is the cause of her symptoms.  I have ordered a nebulizer treatment with albuterol and ipratropium and a dose of prednisone.  I have counseled the patient on the need to stop smoking.  Following nebulizer treatment, patient noted significant improvement.  On reexam, lungs are clear.  I have ordered an albuterol inhaler for her to take home and I am discharging her with a prescription for prednisone.  Final Clinical Impression(s) / ED Diagnoses Final diagnoses:  Influenza B  Bronchospasm  Hyponatremia  Elevated random blood glucose level    Rx / DC Orders ED Discharge Orders          Ordered    predniSONE (DELTASONE) 50 MG tablet  Daily        02/17/22 3244              Delora Fuel, MD 02/12/70 0201

## 2022-02-17 NOTE — Discharge Instructions (Addendum)
Use the inhaler every 4 hours as needed.  Take 2 puffs at a time.  Use the inhaler for shortness of breath, tightness in your chest, cough, wheezing.

## 2022-12-13 DIAGNOSIS — Z419 Encounter for procedure for purposes other than remedying health state, unspecified: Secondary | ICD-10-CM | POA: Diagnosis not present

## 2023-01-12 DIAGNOSIS — Z419 Encounter for procedure for purposes other than remedying health state, unspecified: Secondary | ICD-10-CM | POA: Diagnosis not present

## 2023-02-12 DIAGNOSIS — Z419 Encounter for procedure for purposes other than remedying health state, unspecified: Secondary | ICD-10-CM | POA: Diagnosis not present

## 2023-03-15 DIAGNOSIS — Z419 Encounter for procedure for purposes other than remedying health state, unspecified: Secondary | ICD-10-CM | POA: Diagnosis not present

## 2023-04-12 DIAGNOSIS — Z419 Encounter for procedure for purposes other than remedying health state, unspecified: Secondary | ICD-10-CM | POA: Diagnosis not present

## 2023-05-24 DIAGNOSIS — Z419 Encounter for procedure for purposes other than remedying health state, unspecified: Secondary | ICD-10-CM | POA: Diagnosis not present

## 2023-06-23 DIAGNOSIS — Z419 Encounter for procedure for purposes other than remedying health state, unspecified: Secondary | ICD-10-CM | POA: Diagnosis not present

## 2023-07-24 DIAGNOSIS — Z419 Encounter for procedure for purposes other than remedying health state, unspecified: Secondary | ICD-10-CM | POA: Diagnosis not present

## 2023-08-23 DIAGNOSIS — Z419 Encounter for procedure for purposes other than remedying health state, unspecified: Secondary | ICD-10-CM | POA: Diagnosis not present

## 2023-09-23 DIAGNOSIS — Z419 Encounter for procedure for purposes other than remedying health state, unspecified: Secondary | ICD-10-CM | POA: Diagnosis not present

## 2023-10-24 DIAGNOSIS — Z419 Encounter for procedure for purposes other than remedying health state, unspecified: Secondary | ICD-10-CM | POA: Diagnosis not present

## 2023-11-23 DIAGNOSIS — Z419 Encounter for procedure for purposes other than remedying health state, unspecified: Secondary | ICD-10-CM | POA: Diagnosis not present

## 2024-01-23 DIAGNOSIS — Z419 Encounter for procedure for purposes other than remedying health state, unspecified: Secondary | ICD-10-CM | POA: Diagnosis not present
